# Patient Record
Sex: Female | Born: 1955 | Race: White | Hispanic: No | Marital: Married | Smoking: Never smoker
Health system: Southern US, Community
[De-identification: ages and names within clinical notes are randomized; demographics above are authoritative.]

## PROBLEM LIST (undated history)

## (undated) DIAGNOSIS — R112 Nausea with vomiting, unspecified: Secondary | ICD-10-CM

## (undated) DIAGNOSIS — G4730 Sleep apnea, unspecified: Secondary | ICD-10-CM

## (undated) DIAGNOSIS — E039 Hypothyroidism, unspecified: Secondary | ICD-10-CM

## (undated) DIAGNOSIS — I10 Essential (primary) hypertension: Secondary | ICD-10-CM

## (undated) HISTORY — PX: THYROIDECTOMY: SHX17

## (undated) HISTORY — PX: APPENDECTOMY: SHX54

## (undated) HISTORY — PX: CHOLECYSTECTOMY: SHX55

---

## 2020-01-18 ENCOUNTER — Other Ambulatory Visit: Payer: Self-pay

## 2020-01-18 ENCOUNTER — Emergency Department: Payer: 59

## 2020-01-18 ENCOUNTER — Emergency Department
Admission: EM | Admit: 2020-01-18 | Discharge: 2020-01-18 | Disposition: A | Discharge status: Home / Self Care | Payer: 59 | Attending: Emergency Medicine | Admitting: Emergency Medicine

## 2020-01-18 DIAGNOSIS — Y9301 Activity, walking, marching and hiking: Secondary | ICD-10-CM | POA: Diagnosis not present

## 2020-01-18 DIAGNOSIS — S82034A Nondisplaced transverse fracture of right patella, initial encounter for closed fracture: Secondary | ICD-10-CM | POA: Insufficient documentation

## 2020-01-18 DIAGNOSIS — Z79899 Other long term (current) drug therapy: Secondary | ICD-10-CM | POA: Diagnosis not present

## 2020-01-18 DIAGNOSIS — S8991XA Unspecified injury of right lower leg, initial encounter: Secondary | ICD-10-CM | POA: Diagnosis present

## 2020-01-18 DIAGNOSIS — I10 Essential (primary) hypertension: Secondary | ICD-10-CM | POA: Insufficient documentation

## 2020-01-18 DIAGNOSIS — W19XXXA Unspecified fall, initial encounter: Secondary | ICD-10-CM | POA: Insufficient documentation

## 2020-01-18 DIAGNOSIS — M25461 Effusion, right knee: Secondary | ICD-10-CM | POA: Diagnosis not present

## 2020-01-18 HISTORY — DX: Essential (primary) hypertension: I10

## 2020-01-18 MED ORDER — HYDROCODONE-ACETAMINOPHEN 5-325 MG PO TABS
1.0000 | ORAL_TABLET | Freq: Once | ORAL | Status: AC
  Administered 2020-01-18: 1 via ORAL
  Filled 2020-01-18: qty 1

## 2020-01-18 MED ORDER — HYDROCODONE-ACETAMINOPHEN 5-325 MG PO TABS: 1.0000 | ORAL_TABLET | Freq: Four times a day (QID) | ORAL | 0 refills | Status: AC | PRN

## 2020-01-18 MED ORDER — METHOCARBAMOL 500 MG PO TABS: 500.0000 mg | ORAL_TABLET | Freq: Three times a day (TID) | ORAL | Status: DC | PRN

## 2020-01-18 MED ORDER — NAPROXEN 500 MG PO TABS
500.0000 mg | ORAL_TABLET | Freq: Once | ORAL | Status: AC
  Administered 2020-01-18: 500 mg via ORAL
  Filled 2020-01-18: qty 1

## 2020-01-18 MED ORDER — METHOCARBAMOL 500 MG PO TABS: 500.0000 mg | ORAL_TABLET | Freq: Three times a day (TID) | ORAL | 0 refills | Status: DC | PRN

## 2020-01-18 MED ORDER — NAPROXEN 500 MG PO TABS: 500.0000 mg | ORAL_TABLET | Freq: Two times a day (BID) | ORAL | 0 refills | Status: DC

## 2020-01-18 NOTE — ED Triage Notes (Signed)
First Rn Note: pt presents to ED via [facility] with c/o R knee pain s/p fall after mechanical fall while walking dogs. Per EMS pt with pain with movement/straightening her knee. Reports ice pack placed to R knee prior to arrival.    64HR 150/66

## 2020-01-18 NOTE — ED Notes (Signed)
See triage notes, Pt unable to bear weight on right knee due to pain. Pt AOX4, in wheelchair at this time.

## 2020-01-18 NOTE — ED Provider Notes (Signed)
[facility] Emergency Department Provider Note ____________________________________________  Time seen: Approximately 6:01 PM  I have reviewed the triage vital signs and the nursing notes.   HISTORY  Chief Complaint Knee Pain and Fall    HPI Leslie Hale is a 64 y.o. female who presents to the emergency department for evaluation and treatment of right knee pain after a mechanical, non-syncopal fall while walking the dog. She landed directly on her right knee and was unable to get up without assistance. Pain with any attempts to bear weight. Swelling started immediately.   Past Medical History:  Diagnosis Date  . Hypertension     There are no problems to display for this patient.   History reviewed. No pertinent surgical history.  Prior to Admission medications   Medication Sig Start Date End Date Taking? Authorizing Provider  HYDROcodone-acetaminophen (NORCO/VICODIN) 5-325 MG tablet Take 1 tablet by mouth every 6 (six) hours as needed for up to 3 days for severe pain. 01/18/20 01/21/20  Pharrah Rottman, Kasandra Knudsen, FNP  methocarbamol (ROBAXIN) 500 MG tablet Take 1 tablet (500 mg total) by mouth every 8 (eight) hours as needed. 01/18/20   Bradlee Heitman, Rulon Eisenmenger B, FNP  naproxen (NAPROSYN) 500 MG tablet Take 1 tablet (500 mg total) by mouth 2 (two) times daily with a meal. 01/18/20   Ursula Dermody B, FNP    Allergies Codeine  No family history on file.  Social History Social History   Tobacco Use  . Smoking status: Never Smoker  . Smokeless tobacco: Never Used  Substance Use Topics  . Alcohol use: Yes    Comment: occ  . Drug use: Never    Review of Systems Constitutional: Negative for fever. Cardiovascular: Negative for chest pain. Respiratory: Negative for shortness of breath. Musculoskeletal: Positive for right knee pain Skin: Positive for abrasions over the right knee.  Neurological: Negative for decrease in  sensation  ____________________________________________   PHYSICAL EXAM:  VITAL SIGNS: ED Triage Vitals  Enc Vitals Group     BP 01/18/20 1624 (!) 181/107     Pulse Rate 01/18/20 1624 (!) 55     Resp 01/18/20 1624 18     Temp 01/18/20 1624 98.5 F (36.9 C)     Temp Source 01/18/20 1624 Oral     SpO2 01/18/20 1624 99 %     Weight 01/18/20 1624 180 lb (81.6 kg)     Height 01/18/20 1624 5\' 6"  (1.676 m)     Head Circumference --      Peak Flow --      Pain Score 01/18/20 1631 5     Pain Loc --      Pain Edu? --      Excl. in [place]? --     Constitutional: Alert and oriented. Well appearing and in no acute distress. Eyes: Conjunctivae are clear without discharge or drainage Head: Atraumatic Neck: Supple Respiratory: No cough. Respirations are even and unlabored. Musculoskeletal: Focal tenderness over the patella of the right knee. Little active ROM due to pain. FROM of the hip and ankle of the RLE.  Neurologic: Motor and sensory function intact.  Skin: Abrasions over the right patella with swelling and erythema.  Psychiatric: Affect and behavior are appropriate.  ____________________________________________   LABS (all labs ordered are listed, but only abnormal results are displayed)  Labs Reviewed - No data to display ____________________________________________  RADIOLOGY  Patella fracture.  I, 01/20/20, personally viewed and evaluated these images (plain radiographs) as part of my medical  decision making, as well as reviewing the written report by the radiologist.  DG Knee Complete 4 Views Right  Result Date: 01/18/2020 CLINICAL DATA:  Pain EXAM: RIGHT KNEE - COMPLETE 4+ VIEW COMPARISON:  None. FINDINGS: There is a large joint effusion with lipohemarthrosis. There is a nondisplaced transverse fracture through the lower pole of the patella. There is significant prepatellar soft tissue swelling. There is no dislocation. IMPRESSION: 1. Acute nondisplaced transverse  fracture through the lower pole of the patella. 2. Large joint effusion with lipohemarthrosis. 3. Prepatellar soft tissue swelling. Electronically Signed   By: Katherine Mantle M.D.   On: 01/18/2020 17:43   ____________________________________________   PROCEDURES  .Ortho Injury Treatment  Date/Time: 01/18/2020 7:53 PM Performed by: Chinita Pester, FNP Authorized by: Chinita Pester, FNP   Consent:    Consent obtained:  Verbal   Consent given by:  Patient   Risks discussed:  Stiffness and restricted joint movementInjury location: knee Location details: right knee Injury type: fracture Fracture type: patellar Pre-procedure neurovascular assessment: neurovascularly intact Pre-procedure distal perfusion: normal Pre-procedure neurological function: normal Pre-procedure range of motion: reduced Immobilization: brace Splint type: knee immobilizer. Post-procedure neurovascular assessment: post-procedure neurovascularly intact Post-procedure distal perfusion: normal Post-procedure neurological function: normal Post-procedure range of motion: unchanged Patient tolerance: patient tolerated the procedure well with no immediate complications     ____________________________________________   INITIAL IMPRESSION / ASSESSMENT AND PLAN / ED COURSE  Leslie Hale is a 64 y.o. who presents to the emergency department for evaluation after mechanical, non-syncopal fall prior to arrival. See HPI for further details.   X-ray shows patella fracture. She will be placed in a knee immobilizer and given instructions to RICE. She is to call and schedule a follow up with orthopedics and will be provided a referral. She will be prescribed meloxicam, robaxin, and norco. She is to return to the ER for symptoms that change or worsen or for new concerns if unable to see orthopedics right away.  Medications  methocarbamol (ROBAXIN) tablet 500 mg (has no administration in time range)  naproxen  (NAPROSYN) tablet 500 mg (500 mg Oral Given 01/18/20 1917)  HYDROcodone-acetaminophen (NORCO/VICODIN) 5-325 MG per tablet 1 tablet (1 tablet Oral Given 01/18/20 1917)    Pertinent labs & imaging results that were available during my care of the patient were reviewed by me and considered in my medical decision making (see chart for details).   _________________________________________   FINAL CLINICAL IMPRESSION(S) / ED DIAGNOSES  Final diagnoses:  Closed nondisplaced transverse fracture of right patella, initial encounter  Effusion of right knee    ED Discharge Orders         Ordered    naproxen (NAPROSYN) 500 MG tablet  2 times daily with meals        01/18/20 1758    methocarbamol (ROBAXIN) 500 MG tablet  Every 8 hours PRN        01/18/20 1758    HYDROcodone-acetaminophen (NORCO/VICODIN) 5-325 MG tablet  Every 6 hours PRN        01/18/20 1854           If controlled substance prescribed during this visit, 12 month history viewed on the [facility] prior to issuing an initial prescription for Schedule II or III opiod.   Chinita Pester, FNP 01/18/20 1955    Gilles Chiquito, MD 01/18/20 2216

## 2020-01-18 NOTE — ED Triage Notes (Signed)
Pt comes via EMS with c/o fall and hitting right knee. Pt states she was walking her dogs and lost her balance. Pt states she hurt a pop and unable to bear weight on leg.  Pt has redness and swelling noted to right knee.

## 2020-01-18 NOTE — Discharge Instructions (Signed)
Please call Dr. Neomia Glass office tomorrow to request a follow up appointment.  Take the naprosyn daily and the robaxin if needed for muscle tension/spasm.  Use ice 20 min/hr off and on throughout the day.  Rest, ice, and elevate your leg as much as possible until evaluated by orthopedics.   Return to the ER for symptoms that change or worsen or for new concerns if unable to see orthopedics.

## 2021-05-18 ENCOUNTER — Other Ambulatory Visit: Payer: Self-pay | Admitting: Infectious Diseases

## 2021-05-18 DIAGNOSIS — R7989 Other specified abnormal findings of blood chemistry: Secondary | ICD-10-CM

## 2021-05-18 DIAGNOSIS — Z1231 Encounter for screening mammogram for malignant neoplasm of breast: Secondary | ICD-10-CM

## 2021-05-19 ENCOUNTER — Ambulatory Visit
Admission: RE | Admit: 2021-05-19 | Discharge: 2021-05-19 | Disposition: A | Discharge status: Home / Self Care | Payer: BC Managed Care – PPO | Source: Ambulatory Visit | Attending: Infectious Diseases | Admitting: Infectious Diseases

## 2021-05-19 DIAGNOSIS — R7989 Other specified abnormal findings of blood chemistry: Secondary | ICD-10-CM | POA: Insufficient documentation

## 2021-05-21 ENCOUNTER — Other Ambulatory Visit: Payer: Self-pay

## 2021-05-21 ENCOUNTER — Emergency Department: Payer: BC Managed Care – PPO

## 2021-05-21 ENCOUNTER — Observation Stay
Admission: EM | Admit: 2021-05-21 | Discharge: 2021-05-23 | Disposition: A | Discharge status: Home / Self Care | Payer: BC Managed Care – PPO | Attending: Emergency Medicine | Admitting: Emergency Medicine

## 2021-05-21 ENCOUNTER — Encounter: Payer: Self-pay | Admitting: *Deleted

## 2021-05-21 DIAGNOSIS — U071 COVID-19: Secondary | ICD-10-CM | POA: Insufficient documentation

## 2021-05-21 DIAGNOSIS — K8012 Calculus of gallbladder with acute and chronic cholecystitis without obstruction: Secondary | ICD-10-CM | POA: Diagnosis not present

## 2021-05-21 DIAGNOSIS — Z79899 Other long term (current) drug therapy: Secondary | ICD-10-CM | POA: Diagnosis not present

## 2021-05-21 DIAGNOSIS — R7989 Other specified abnormal findings of blood chemistry: Secondary | ICD-10-CM | POA: Insufficient documentation

## 2021-05-21 DIAGNOSIS — Z419 Encounter for procedure for purposes other than remedying health state, unspecified: Secondary | ICD-10-CM

## 2021-05-21 DIAGNOSIS — I10 Essential (primary) hypertension: Secondary | ICD-10-CM | POA: Diagnosis not present

## 2021-05-21 DIAGNOSIS — K8020 Calculus of gallbladder without cholecystitis without obstruction: Secondary | ICD-10-CM | POA: Diagnosis present

## 2021-05-21 DIAGNOSIS — R1011 Right upper quadrant pain: Secondary | ICD-10-CM

## 2021-05-21 DIAGNOSIS — K8021 Calculus of gallbladder without cholecystitis with obstruction: Secondary | ICD-10-CM

## 2021-05-21 HISTORY — DX: Other specified postprocedural states: R11.2

## 2021-05-21 LAB — URINALYSIS, ROUTINE W REFLEX MICROSCOPIC
Bacteria, UA: NONE SEEN
Glucose, UA: 100 mg/dL — AB
Leukocytes,Ua: NEGATIVE
Nitrite: NEGATIVE
Protein, ur: NEGATIVE mg/dL
Specific Gravity, Urine: >1.03 — ABNORMAL HIGH (ref 1.005–1.030)
pH: 5 (ref 5.0–8.0)

## 2021-05-21 LAB — COMPREHENSIVE METABOLIC PANEL
ALT: 319 U/L — ABNORMAL HIGH (ref 0–44)
AST: 308 U/L — ABNORMAL HIGH (ref 15–41)
Albumin: 3.8 g/dL (ref 3.5–5.0)
Alkaline Phosphatase: 157 U/L — ABNORMAL HIGH (ref 38–126)
Anion gap: 10 (ref 5–15)
BUN: 16 mg/dL (ref 8–23)
CO2: 26 mmol/L (ref 22–32)
Calcium: 9.3 mg/dL (ref 8.9–10.3)
Chloride: 103 mmol/L (ref 98–111)
Creatinine, Ser: 0.79 mg/dL (ref 0.44–1.00)
GFR, Estimated: >60 mL/min (ref >60)
Glucose, Bld: 145 mg/dL — ABNORMAL HIGH (ref 70–99)
Potassium: 4 mmol/L (ref 3.5–5.1)
Sodium: 139 mmol/L (ref 135–145)
Total Bilirubin: 1.6 mg/dL — ABNORMAL HIGH (ref 0.3–1.2)
Total Protein: 7 g/dL (ref 6.5–8.1)

## 2021-05-21 LAB — CBC
HCT: 38.5 % (ref 36.0–46.0)
Hemoglobin: 12.6 g/dL (ref 12.0–15.0)
MCH: 31.5 pg (ref 26.0–34.0)
MCHC: 32.7 g/dL (ref 30.0–36.0)
MCV: 96.3 fL (ref 80.0–100.0)
Platelets: 261 10*3/uL (ref 150–400)
RBC: 4 MIL/uL (ref 3.87–5.11)
RDW: 13.1 % (ref 11.5–15.5)
WBC: 7.8 10*3/uL (ref 4.0–10.5)
nRBC: 0 % (ref 0.0–0.2)

## 2021-05-21 LAB — LIPASE, BLOOD: Lipase: 30 U/L (ref 11–51)

## 2021-05-21 MED ORDER — KETOROLAC TROMETHAMINE 30 MG/ML IJ SOLN
15.0000 mg | Freq: Once | INTRAMUSCULAR | Status: AC
  Administered 2021-05-21: 15 mg via INTRAVENOUS
  Filled 2021-05-21: qty 1

## 2021-05-21 MED ORDER — SODIUM CHLORIDE 0.9 % IV SOLN: Freq: Once | INTRAVENOUS | Status: AC

## 2021-05-21 MED ORDER — ONDANSETRON HCL 4 MG/2ML IJ SOLN
4.0000 mg | Freq: Once | INTRAMUSCULAR | Status: AC
  Administered 2021-05-21: 4 mg via INTRAVENOUS
  Filled 2021-05-21: qty 2

## 2021-05-21 MED ORDER — MORPHINE SULFATE (PF) 4 MG/ML IV SOLN: 4.0000 mg | INTRAVENOUS | Status: DC | PRN

## 2021-05-21 MED ORDER — SODIUM CHLORIDE 0.9 % IV BOLUS
500.0000 mL | Freq: Once | INTRAVENOUS | Status: AC
  Administered 2021-05-21: 500 mL via INTRAVENOUS

## 2021-05-21 NOTE — ED Triage Notes (Addendum)
Pt has right upper abd pain.  Pt has appt with surgeon tomorrow for gallbladder problem.  Pain worse tonight.   No n/v/   denies chest pain or sob.  pt alert  speech clear.

## 2021-05-21 NOTE — ED Provider Notes (Signed)
[facility] Provider Note    Event Date/Time   First MD Initiated Contact with Patient 05/21/21 2129     (approximate)   History   Abdominal Pain   HPI  Leslie Hale is a 66 y.o. female with recent diagnosis of cholelithiasis recent COVID illness and flulike illness over the holidays presents to the ER for worsening right upper quadrant pain associate with nausea vomiting and chills.  States the pain is moderate to severe aching and throbbing to the right upper quadrant.  No radiation through to her back or shoulder.  Has never had symptoms like this before.  No recent new medications.     Physical Exam   Triage Vital Signs: ED Triage Vitals  Enc Vitals Group     BP 05/21/21 2242 (!) 144/62     Pulse Rate 05/21/21 2242 (!) 53     Resp 05/21/21 2242 18     Temp 05/21/21 2242 97.9 F (36.6 C)     Temp Source 05/21/21 2242 Oral     SpO2 05/21/21 2242 100 %     Weight 05/21/21 2043 180 lb (81.6 kg)     Height 05/21/21 2043 5\' 6"  (1.676 m)     Head Circumference --      Peak Flow --      Pain Score 05/21/21 2043 8     Pain Loc --      Pain Edu? --      Excl. in [place]? --     Most recent vital signs: Vitals:   05/21/21 2242  BP: (!) 144/62  Pulse: (!) 53  Resp: 18  Temp: 97.9 F (36.6 C)  SpO2: 100%     Constitutional: Alert  Eyes: Conjunctivae are normal.  Head: Atraumatic. Nose: No congestion/rhinnorhea. Mouth/Throat: Mucous membranes are moist.   Neck: Painless ROM.  Cardiovascular:   Good peripheral circulation. Respiratory: Normal respiratory effort.  No retractions.  Gastrointestinal: Soft with mild epigastric and ruq ttp, no guarding or rebound Musculoskeletal:  no deformity Neurologic:  MAE spontaneously. No gross focal neurologic deficits are appreciated.  Skin:  Skin is warm, dry and intact. No rash noted. Psychiatric: Mood and affect are normal. Speech and behavior are normal.    ED Results / Procedures / Treatments    Labs (all labs ordered are listed, but only abnormal results are displayed) Labs Reviewed  COMPREHENSIVE METABOLIC PANEL - Abnormal; Notable for the following components:      Result Value   Glucose, Bld 145 (*)    AST 308 (*)    ALT 319 (*)    Alkaline Phosphatase 157 (*)    Total Bilirubin 1.6 (*)    All other components within normal limits  URINALYSIS, ROUTINE W REFLEX MICROSCOPIC - Abnormal; Notable for the following components:   APPearance CLEAR (*)    Specific Gravity, Urine >1.030 (*)    Glucose, UA 100 (*)    Hgb urine dipstick TRACE (*)    Bilirubin Urine SMALL (*)    Ketones, ur TRACE (*)    All other components within normal limits  LIPASE, BLOOD  CBC     EKG     RADIOLOGY Please see ED Course for my review and interpretation.  I personally reviewed all radiographic images ordered to evaluate for the above acute complaints and reviewed radiology reports and findings.  These findings were personally discussed with the patient.  Please see medical record for radiology report.    PROCEDURES:  Critical  Care performed: No  Procedures   MEDICATIONS ORDERED IN ED: Medications  morphine 4 MG/ML injection 4 mg (has no administration in time range)  sodium chloride 0.9 % bolus 500 mL (has no administration in time range)  0.9 %  sodium chloride infusion (has no administration in time range)  sodium chloride 0.9 % bolus 500 mL (500 mLs Intravenous New Bag/Given 05/21/21 2237)  ketorolac (TORADOL) 30 MG/ML injection 15 mg (15 mg Intravenous Given 05/21/21 2237)  ondansetron (ZOFRAN) injection 4 mg (4 mg Intravenous Given 05/21/21 2237)     IMPRESSION / MDM / ASSESSMENT AND PLAN / ED COURSE  I reviewed the triage vital signs and the nursing notes.                              Differential diagnosis includes, but is not limited to, cholelithiasis, choledocholithiasis, cholecystitis, enteritis, dehydration, biliary colic, hepatitis  Presentation as  described above.  Patient is dehydrated appearing uncomfortable appearing as well have ordered IV pain medication including IV narcotics and IV Toradol as well as IV antiemetic and IV fluids.  Does not have any leukocytosis but does have persistently elevated LFTs we will repeat ultrasound of the right upper quadrant.   Clinical Course as of 05/21/21 2259  Thu May 21, 2021  2236 Right upper quadrant ultrasound by my review shows evidence of cholelithiasis. [PR]  2251 I discussed the results of elevated LFTs with normal white count and ultrasound results with Dr. Allen Norris of GI who has recommended MRCP for further evaluation.  This plan was discussed with the patient and family at bedside.  She is agreeable to plan.  Patient will be signed out to oncoming physician [PR]    Clinical Course User Index [PR] Merlyn Lot, MD     FINAL CLINICAL IMPRESSION(S) / ED DIAGNOSES   Final diagnoses:  Elevated LFTs  Calculus of gallbladder with biliary obstruction but without cholecystitis     Rx / DC Orders   ED Discharge Orders     None        Note:  This document was prepared using Dragon voice recognition software and may include unintentional dictation errors.    Merlyn Lot, MD 05/21/21 2300

## 2021-05-21 NOTE — ED Notes (Signed)
Pt unable to void at this time. 

## 2021-05-22 ENCOUNTER — Encounter
Admission: EM | Disposition: A | Discharge status: Home / Self Care | Payer: Self-pay | Source: Home / Self Care | Attending: Student in an Organized Health Care Education/Training Program

## 2021-05-22 ENCOUNTER — Observation Stay: Payer: BC Managed Care – PPO

## 2021-05-22 ENCOUNTER — Other Ambulatory Visit: Payer: Self-pay

## 2021-05-22 ENCOUNTER — Observation Stay: Payer: BC Managed Care – PPO | Admitting: Certified Registered Nurse Anesthetist

## 2021-05-22 ENCOUNTER — Encounter: Payer: Self-pay | Admitting: Surgery

## 2021-05-22 DIAGNOSIS — K8020 Calculus of gallbladder without cholecystitis without obstruction: Secondary | ICD-10-CM | POA: Diagnosis present

## 2021-05-22 HISTORY — PX: INTRAOPERATIVE CHOLANGIOGRAM: SHX5230

## 2021-05-22 LAB — RESP PANEL BY RT-PCR (FLU A&B, COVID) ARPGX2
Influenza A by PCR: NEGATIVE
Influenza B by PCR: NEGATIVE
SARS Coronavirus 2 by RT PCR: POSITIVE — AB

## 2021-05-22 LAB — CBC
HCT: 40.2 % (ref 36.0–46.0)
Hemoglobin: 13.2 g/dL (ref 12.0–15.0)
MCH: 31.1 pg (ref 26.0–34.0)
MCHC: 32.8 g/dL (ref 30.0–36.0)
MCV: 94.8 fL (ref 80.0–100.0)
Platelets: 248 10*3/uL (ref 150–400)
RBC: 4.24 MIL/uL (ref 3.87–5.11)
RDW: 13.1 % (ref 11.5–15.5)
WBC: 5 10*3/uL (ref 4.0–10.5)
nRBC: 0 % (ref 0.0–0.2)

## 2021-05-22 LAB — COMPREHENSIVE METABOLIC PANEL
ALT: 466 U/L — ABNORMAL HIGH (ref 0–44)
AST: 526 U/L — ABNORMAL HIGH (ref 15–41)
Albumin: 3.7 g/dL (ref 3.5–5.0)
Alkaline Phosphatase: 184 U/L — ABNORMAL HIGH (ref 38–126)
Anion gap: 9 (ref 5–15)
BUN: 12 mg/dL (ref 8–23)
CO2: 25 mmol/L (ref 22–32)
Calcium: 8.8 mg/dL — ABNORMAL LOW (ref 8.9–10.3)
Chloride: 105 mmol/L (ref 98–111)
Creatinine, Ser: 0.69 mg/dL (ref 0.44–1.00)
GFR, Estimated: >60 mL/min (ref >60)
Glucose, Bld: 85 mg/dL (ref 70–99)
Potassium: 3.9 mmol/L (ref 3.5–5.1)
Sodium: 139 mmol/L (ref 135–145)
Total Bilirubin: 2.4 mg/dL — ABNORMAL HIGH (ref 0.3–1.2)
Total Protein: 6.9 g/dL (ref 6.5–8.1)

## 2021-05-22 LAB — MAGNESIUM: Magnesium: 2.2 mg/dL (ref 1.7–2.4)

## 2021-05-22 LAB — HIV ANTIBODY (ROUTINE TESTING W REFLEX): HIV Screen 4th Generation wRfx: NONREACTIVE

## 2021-05-22 SURGERY — CHOLECYSTECTOMY, ROBOT-ASSISTED, LAPAROSCOPIC
Anesthesia: General | Site: Abdomen

## 2021-05-22 MED ORDER — TRAMADOL HCL 50 MG PO TABS
50.0000 mg | ORAL_TABLET | Freq: Four times a day (QID) | ORAL | 0 refills | Status: AC | PRN
Start: 2021-05-22 — End: 2022-05-22

## 2021-05-22 MED ORDER — FENTANYL CITRATE (PF) 100 MCG/2ML IJ SOLN
25.0000 ug | INTRAMUSCULAR | Status: DC | PRN
  Administered 2021-05-22 (×2): 25 ug via INTRAVENOUS

## 2021-05-22 MED ORDER — FENTANYL CITRATE (PF) 100 MCG/2ML IJ SOLN
INTRAMUSCULAR | Status: DC | PRN
  Administered 2021-05-22: 50 ug via INTRAVENOUS

## 2021-05-22 MED ORDER — CEFAZOLIN SODIUM-DEXTROSE 2-4 GM/100ML-% IV SOLN
INTRAVENOUS | Status: AC
  Filled 2021-05-22: qty 100

## 2021-05-22 MED ORDER — MIDAZOLAM HCL 2 MG/2ML IJ SOLN
INTRAMUSCULAR | Status: AC
  Filled 2021-05-22: qty 2

## 2021-05-22 MED ORDER — ONDANSETRON HCL 4 MG/2ML IJ SOLN: 4.0000 mg | Freq: Four times a day (QID) | INTRAMUSCULAR | Status: DC | PRN

## 2021-05-22 MED ORDER — 0.9 % SODIUM CHLORIDE (POUR BTL) OPTIME
TOPICAL | Status: DC | PRN
  Administered 2021-05-22: 500 mL

## 2021-05-22 MED ORDER — PROMETHAZINE HCL 25 MG/ML IJ SOLN
INTRAMUSCULAR | Status: AC
  Filled 2021-05-22: qty 1

## 2021-05-22 MED ORDER — DEXAMETHASONE SODIUM PHOSPHATE 10 MG/ML IJ SOLN
INTRAMUSCULAR | Status: DC | PRN
Start: 2021-05-22 — End: 2021-05-22
  Administered 2021-05-22: 10 mg via INTRAVENOUS

## 2021-05-22 MED ORDER — PROMETHAZINE HCL 25 MG/ML IJ SOLN
6.2500 mg | INTRAMUSCULAR | Status: DC | PRN
  Administered 2021-05-22: 6.25 mg via INTRAVENOUS

## 2021-05-22 MED ORDER — GLYCOPYRROLATE 0.2 MG/ML IJ SOLN
INTRAMUSCULAR | Status: DC | PRN
  Administered 2021-05-22: .2 mg via INTRAVENOUS

## 2021-05-22 MED ORDER — ACETAMINOPHEN 500 MG PO TABS
1000.0000 mg | ORAL_TABLET | Freq: Four times a day (QID) | ORAL | Status: DC
  Administered 2021-05-23: 07:00:00 1000 mg via ORAL
  Filled 2021-05-22 (×4): qty 2

## 2021-05-22 MED ORDER — MIDAZOLAM HCL 2 MG/2ML IJ SOLN
INTRAMUSCULAR | Status: DC | PRN
  Administered 2021-05-22: 2 mg via INTRAVENOUS

## 2021-05-22 MED ORDER — ATENOLOL 25 MG PO TABS
25.0000 mg | ORAL_TABLET | Freq: Every day | ORAL | Status: DC
  Administered 2021-05-22: 25 mg via ORAL
  Filled 2021-05-22 (×2): qty 1

## 2021-05-22 MED ORDER — APREPITANT 40 MG PO CAPS: 40.0000 mg | ORAL_CAPSULE | Freq: Once | ORAL | Status: AC

## 2021-05-22 MED ORDER — APREPITANT 40 MG PO CAPS
ORAL_CAPSULE | ORAL | Status: AC
  Administered 2021-05-22: 40 mg via ORAL
  Filled 2021-05-22: qty 1

## 2021-05-22 MED ORDER — SODIUM CHLORIDE (PF) 0.9 % IJ SOLN
INTRAMUSCULAR | Status: AC
  Filled 2021-05-22: qty 50

## 2021-05-22 MED ORDER — ATENOLOL 50 MG PO TABS
50.0000 mg | ORAL_TABLET | Freq: Every day | ORAL | Status: DC
  Filled 2021-05-22: qty 1

## 2021-05-22 MED ORDER — SUGAMMADEX SODIUM 500 MG/5ML IV SOLN
INTRAVENOUS | Status: DC | PRN
  Administered 2021-05-22: 300 mg via INTRAVENOUS

## 2021-05-22 MED ORDER — LACTATED RINGERS IV SOLN: INTRAVENOUS | Status: DC

## 2021-05-22 MED ORDER — CEFAZOLIN SODIUM-DEXTROSE 2-4 GM/100ML-% IV SOLN
2.0000 g | INTRAVENOUS | Status: AC
  Administered 2021-05-22: 2 g via INTRAVENOUS

## 2021-05-22 MED ORDER — SODIUM CHLORIDE 0.9 % IV SOLN
INTRAVENOUS | Status: DC | PRN
  Administered 2021-05-22: 60 mL

## 2021-05-22 MED ORDER — BUPIVACAINE-EPINEPHRINE 0.25% -1:200000 IJ SOLN
INTRAMUSCULAR | Status: DC | PRN
  Administered 2021-05-22: 30 mL

## 2021-05-22 MED ORDER — HYDROMORPHONE HCL 1 MG/ML IJ SOLN: 0.5000 mg | INTRAMUSCULAR | Status: DC | PRN

## 2021-05-22 MED ORDER — PANTOPRAZOLE SODIUM 40 MG IV SOLR: 40.0000 mg | Freq: Every day | INTRAVENOUS | Status: DC

## 2021-05-22 MED ORDER — KETOROLAC TROMETHAMINE 30 MG/ML IJ SOLN
INTRAMUSCULAR | Status: DC | PRN
  Administered 2021-05-22: 30 mg via INTRAVENOUS

## 2021-05-22 MED ORDER — PROPOFOL 10 MG/ML IV BOLUS
INTRAVENOUS | Status: DC | PRN
Start: 2021-05-22 — End: 2021-05-22
  Administered 2021-05-22: 200 mg via INTRAVENOUS

## 2021-05-22 MED ORDER — PANTOPRAZOLE SODIUM 40 MG IV SOLR
40.0000 mg | INTRAVENOUS | Status: DC
  Administered 2021-05-22: 40 mg via INTRAVENOUS
  Filled 2021-05-22: qty 40

## 2021-05-22 MED ORDER — FENTANYL CITRATE (PF) 100 MCG/2ML IJ SOLN
INTRAMUSCULAR | Status: AC
  Filled 2021-05-22: qty 2

## 2021-05-22 MED ORDER — POLYETHYLENE GLYCOL 3350 17 G PO PACK: 17.0000 g | PACK | Freq: Every day | ORAL | Status: DC | PRN

## 2021-05-22 MED ORDER — LACTATED RINGERS IV SOLN: INTRAVENOUS | Status: DC | PRN

## 2021-05-22 MED ORDER — ROCURONIUM BROMIDE 100 MG/10ML IV SOLN
INTRAVENOUS | Status: DC | PRN
  Administered 2021-05-22: 50 mg via INTRAVENOUS
  Administered 2021-05-22: 10 mg via INTRAVENOUS

## 2021-05-22 MED ORDER — ONDANSETRON HCL 4 MG/2ML IJ SOLN
INTRAMUSCULAR | Status: DC | PRN
  Administered 2021-05-22: 4 mg via INTRAVENOUS

## 2021-05-22 MED ORDER — BUPIVACAINE-EPINEPHRINE (PF) 0.25% -1:200000 IJ SOLN
INTRAMUSCULAR | Status: AC
  Filled 2021-05-22: qty 30

## 2021-05-22 MED ORDER — SODIUM CHLORIDE FLUSH 0.9 % IV SOLN
INTRAVENOUS | Status: AC
  Filled 2021-05-22: qty 10

## 2021-05-22 MED ORDER — PHENYLEPHRINE HCL-NACL 20-0.9 MG/250ML-% IV SOLN
INTRAVENOUS | Status: DC | PRN
  Administered 2021-05-22: 40 ug/min via INTRAVENOUS

## 2021-05-22 MED ORDER — SEVOFLURANE IN SOLN
RESPIRATORY_TRACT | Status: AC
  Filled 2021-05-22: qty 250

## 2021-05-22 MED ORDER — LIDOCAINE HCL (CARDIAC) PF 100 MG/5ML IV SOSY
PREFILLED_SYRINGE | INTRAVENOUS | Status: DC | PRN
  Administered 2021-05-22: 100 mg via INTRAVENOUS

## 2021-05-22 MED ORDER — LEVOTHYROXINE SODIUM 100 MCG PO TABS
100.0000 ug | ORAL_TABLET | Freq: Every day | ORAL | Status: DC
  Administered 2021-05-22 – 2021-05-23 (×2): 100 ug via ORAL
  Filled 2021-05-22: qty 1
  Filled 2021-05-22: qty 2

## 2021-05-22 MED ORDER — FENTANYL CITRATE (PF) 100 MCG/2ML IJ SOLN
INTRAMUSCULAR | Status: AC
  Administered 2021-05-22: 25 ug via INTRAVENOUS
  Filled 2021-05-22: qty 2

## 2021-05-22 MED ORDER — INDOCYANINE GREEN 25 MG IV SOLR
2.5000 mg | INTRAVENOUS | Status: AC
  Administered 2021-05-22: 2.5 mg via INTRAVENOUS
  Filled 2021-05-22: qty 1

## 2021-05-22 MED ORDER — ONDANSETRON 4 MG PO TBDP
4.0000 mg | ORAL_TABLET | Freq: Four times a day (QID) | ORAL | Status: DC | PRN
  Filled 2021-05-22: qty 1

## 2021-05-22 SURGICAL SUPPLY — 52 items
BAG INFUSER PRESSURE 100CC (MISCELLANEOUS) IMPLANT
BLADE SURG SZ11 CARB STEEL (BLADE) ×2 IMPLANT
CANNULA REDUC XI 12-8 STAPL (CANNULA) ×1
CANNULA REDUCER 12-8 DVNC XI (CANNULA) ×1 IMPLANT
CATH REDDICK CHOLANGI 4FR 50CM (CATHETERS) ×1 IMPLANT
CLIP LIGATING HEM O LOK PURPLE (MISCELLANEOUS) ×2 IMPLANT
CLIP LIGATING HEMO O LOK GREEN (MISCELLANEOUS) ×2 IMPLANT
DECANTER SPIKE VIAL GLASS SM (MISCELLANEOUS) ×4 IMPLANT
DERMABOND ADVANCED (GAUZE/BANDAGES/DRESSINGS) ×1
DERMABOND ADVANCED .7 DNX12 (GAUZE/BANDAGES/DRESSINGS) ×1 IMPLANT
DRAPE ARM DVNC X/XI (DISPOSABLE) ×4 IMPLANT
DRAPE C-ARM XRAY 36X54 (DRAPES) ×1 IMPLANT
DRAPE COLUMN DVNC XI (DISPOSABLE) ×1 IMPLANT
DRAPE DA VINCI XI ARM (DISPOSABLE) ×4
DRAPE DA VINCI XI COLUMN (DISPOSABLE) ×1
ELECT REM PT RETURN 9FT ADLT (ELECTROSURGICAL) ×2
ELECTRODE REM PT RTRN 9FT ADLT (ELECTROSURGICAL) ×1 IMPLANT
GLOVE SURG ENC MOIS LTX SZ6.5 (GLOVE) ×4 IMPLANT
GLOVE SURG UNDER POLY LF SZ6.5 (GLOVE) ×4 IMPLANT
GOWN STRL REUS W/ TWL LRG LVL3 (GOWN DISPOSABLE) ×3 IMPLANT
GOWN STRL REUS W/TWL LRG LVL3 (GOWN DISPOSABLE) ×3
GRASPER SUT TROCAR 14GX15 (MISCELLANEOUS) ×2 IMPLANT
IRRIGATOR SUCT 8 DISP DVNC XI (IRRIGATION / IRRIGATOR) IMPLANT
IRRIGATOR SUCTION 8MM XI DISP (IRRIGATION / IRRIGATOR)
IV CATH ANGIO 12GX3 LT BLUE (NEEDLE) ×1 IMPLANT
IV NS 1000ML (IV SOLUTION)
IV NS 1000ML BAXH (IV SOLUTION) IMPLANT
KIT PINK PAD W/HEAD ARE REST (MISCELLANEOUS) ×2 IMPLANT
KIT PINK PAD W/HEAD ARM REST (MISCELLANEOUS) ×1 IMPLANT
LABEL OR SOLS (LABEL) ×2 IMPLANT
MANIFOLD NEPTUNE II (INSTRUMENTS) ×2 IMPLANT
NDL INSUFFLATION 14GA 120MM (NEEDLE) ×1 IMPLANT
NEEDLE HYPO 22GX1.5 SAFETY (NEEDLE) ×2 IMPLANT
NEEDLE INSUFFLATION 14GA 120MM (NEEDLE) ×2 IMPLANT
NS IRRIG 500ML POUR BTL (IV SOLUTION) ×2 IMPLANT
OBTURATOR OPTICAL STANDARD 8MM (TROCAR) ×1
OBTURATOR OPTICAL STND 8 DVNC (TROCAR) ×1
OBTURATOR OPTICALSTD 8 DVNC (TROCAR) ×1 IMPLANT
PACK LAP CHOLECYSTECTOMY (MISCELLANEOUS) ×2 IMPLANT
POUCH SPECIMEN RETRIEVAL 10MM (ENDOMECHANICALS) ×2 IMPLANT
SEAL CANN UNIV 5-8 DVNC XI (MISCELLANEOUS) ×3 IMPLANT
SEAL XI 5MM-8MM UNIVERSAL (MISCELLANEOUS) ×3
SET TUBE SMOKE EVAC HIGH FLOW (TUBING) ×2 IMPLANT
SOLUTION ELECTROLUBE (MISCELLANEOUS) ×2 IMPLANT
SPONGE T-LAP 4X18 ~~LOC~~+RFID (SPONGE) IMPLANT
STAPLER CANNULA SEAL DVNC XI (STAPLE) ×1 IMPLANT
STAPLER CANNULA SEAL XI (STAPLE) ×1
SUT MNCRL 4-0 (SUTURE) ×2
SUT MNCRL 4-0 27XMFL (SUTURE) ×2
SUT VICRYL 0 AB UR-6 (SUTURE) ×2 IMPLANT
SUTURE MNCRL 4-0 27XMF (SUTURE) ×1 IMPLANT
WATER STERILE IRR 500ML POUR (IV SOLUTION) ×2 IMPLANT

## 2021-05-22 NOTE — Transfer of Care (Signed)
Immediate Anesthesia Transfer of Care Note  Patient: Leslie Hale  Procedure(s) Performed: XI ROBOTIC ASSISTED LAPAROSCOPIC CHOLECYSTECTOMY (Abdomen) INDOCYANINE GREEN FLUORESCENCE IMAGING (ICG) (Abdomen) INTRAOPERATIVE CHOLANGIOGRAM (Abdomen)  Patient Location: PACU  Anesthesia Type:General  Level of Consciousness: awake  Airway & Oxygen Therapy: Patient Spontanous Breathing and Patient connected to nasal cannula oxygen  Post-op Assessment: Report given to RN and Post -op Vital signs reviewed and stable  Post vital signs: Reviewed and stable  Last Vitals:  Vitals Value Taken Time  BP 119/57 05/22/21 1715  Temp 36.4 C 05/22/21 1707  Pulse 74 05/22/21 1727  Resp 12 05/22/21 1727  SpO2 100 % 05/22/21 1727  Vitals shown include unvalidated device data.  Last Pain:  Vitals:   05/22/21 1715  TempSrc:   PainSc: 0-No pain      Patients Stated Pain Goal: 0 (05/22/21 1336)  Complications: No notable events documented.

## 2021-05-22 NOTE — Op Note (Signed)
Preoperative diagnosis: Acute cholecystitis and jaundice  Postoperative diagnosis: Cholecystitis  Procedure: Robotic Assisted Laparoscopic Cholecystectomy.   Anesthesia: GETA   Surgeon: Dr. Windell Moment  Wound Classification: Clean Contaminated  Indications: Patient is a 66 y.o. female developed right upper quadrant pain and on workup was found to have cholelithiasis with a dilated common duct.  MRCP negative for choledocholithiasis but bilirubin was in increasing trend. Robotic Assisted Laparoscopic cholecystectomy with intra operative cholangiogram was elected.  Findings: Critical view of safety achieved Cystic duct and artery identified, ligated and divided No filling defect identified on cholangiogram Adequate hemostasis    Description of procedure: The patient was placed on the operating table in the supine position. General anesthesia was induced. A time-out was completed verifying correct patient, procedure, site, positioning, and implant(s) and/or special equipment prior to beginning this procedure. An orogastric tube was placed. The abdomen was prepped and draped in the usual sterile fashion.  An incision was made in a natural skin line below the umbilicus.  The fascia was elevated and the Veress needle inserted. Proper position was confirmed by aspiration and saline meniscus test.  The abdomen was insufflated with carbon dioxide to a pressure of 15 mmHg. The patient tolerated insufflation well. A 8-mm trocar was then inserted in optiview fashion.  The laparoscope was inserted and the abdomen inspected. No injuries from initial trocar placement were noted. Additional trocars were then inserted in the following locations: an 8-mm trocar in the left lateral abdomen, and another two 8-mm trocars to the right side of the abdomen 5 cm appart. The umbilical trocar was changed to a 12 mm trocar all under direct visualization. Angio cath was inserted in right upper quadrant. The abdomen  was inspected and no abnormalities were found. The table was placed in the reverse Trendelenburg position with the right side up. The robotic arms were docked and target anatomy identified. Instrument inserted under direct visualization.  Filmy adhesions between the gallbladder and omentum, duodenum and transverse colon were lysed with electrocautery. The dome of the gallbladder was grasped with a prograsp and retracted over the dome of the liver. The infundibulum was also grasped with an atraumatic grasper and retracted toward the right lower quadrant. This maneuver exposed Calots triangle. The peritoneum overlying the gallbladder infundibulum was then incised and the cystic duct and cystic artery identified and circumferentially dissected. Critical view of safety reviewed before ligating any structure. Firefly images taken to visualize biliary ducts.  A large clip was placed as close to the cystic duct and gallbladder as possible. Small cystic ductotomy was done. Catheter was inserted through angio cath and into the cystic duct. Bile was aspirated and saline flushed without resistance. Contrast was then flushed and fluoroscopy images were reviewed. No contrast extravasation or filling defect was identified. Catheter removed.   The cystic duct and cystic artery were then doubly clipped and divided close to the gallbladder.  The gallbladder was then dissected from its peritoneal attachments by electrocautery. Hemostasis was checked and the gallbladder and contained stones were removed using an endoscopic retrieval bag. The gallbladder was passed off the table as a specimen. There was no evidence of bleeding from the gallbladder fossa or cystic artery or leakage of the bile from the cystic duct stump. Secondary trocars were removed under direct vision. No bleeding was noted. The robotic arms were undoked. The scope was withdrawn and the umbilical trocar removed. The abdomen was allowed to collapse. The fascia of  the 72mm trocar sites was closed with  figure-of-eight 0 vicryl sutures. The skin was closed with subcuticular sutures of 4-0 monocryl and topical skin adhesive. The orogastric tube was removed.  The patient tolerated the procedure well and was taken to the postanesthesia care unit in stable condition.   Specimen: Gallbladder  Complications: None  EBL: 5 mL

## 2021-05-22 NOTE — Anesthesia Procedure Notes (Signed)
Procedure Name: Intubation Date/Time: 05/22/2021 2:43 PM Performed by: Beverely Low, CRNA Pre-anesthesia Checklist: Patient identified, Patient being monitored, Timeout performed, Emergency Drugs available and Suction available Patient Re-evaluated:Patient Re-evaluated prior to induction Oxygen Delivery Method: Circle system utilized Preoxygenation: Pre-oxygenation with 100% oxygen Induction Type: IV induction Ventilation: Mask ventilation without difficulty Laryngoscope Size: McGraph and 4 Grade View: Grade I Tube type: Oral Tube size: 7.0 mm Number of attempts: 1 Airway Equipment and Method: Stylet Placement Confirmation: ETT inserted through vocal cords under direct vision, positive ETCO2 and breath sounds checked- equal and bilateral Secured at: 21 cm Tube secured with: Tape Dental Injury: Teeth and Oropharynx as per pre-operative assessment

## 2021-05-22 NOTE — Anesthesia Preprocedure Evaluation (Signed)
Anesthesia Evaluation  Patient identified by MRN, date of birth, ID band Patient awake    Reviewed: Allergy & Precautions, H&P , NPO status , Patient's Chart, lab work & pertinent test results, reviewed documented beta blocker date and time   History of Anesthesia Complications (+) PONV and history of anesthetic complications  Airway Mallampati: II  TM Distance: >3 FB Neck ROM: full    Dental  (+) Dental Advidsory Given, Teeth Intact   Pulmonary neg pulmonary ROS,    Pulmonary exam normal breath sounds clear to auscultation       Cardiovascular Exercise Tolerance: Good hypertension, (-) angina(-) Past MI and (-) Cardiac Stents Normal cardiovascular exam(-) dysrhythmias (-) Valvular Problems/Murmurs Rhythm:regular Rate:Normal     Neuro/Psych negative neurological ROS  negative psych ROS   GI/Hepatic negative GI ROS, Neg liver ROS,   Endo/Other  negative endocrine ROS  Renal/GU negative Renal ROS  negative genitourinary   Musculoskeletal   Abdominal   Peds  Hematology negative hematology ROS (+)   Anesthesia Other Findings Past Medical History: No date: Hypertension No date: PONV (postoperative nausea and vomiting)   Reproductive/Obstetrics negative OB ROS                             Anesthesia Physical Anesthesia Plan  ASA: 2  Anesthesia Plan: General   Post-op Pain Management:    Induction: Intravenous  PONV Risk Score and Plan: 4 or greater and Aprepitant, Ondansetron, Dexamethasone, Midazolam and Treatment may vary due to age or medical condition  Airway Management Planned: Oral ETT  Additional Equipment:   Intra-op Plan:   Post-operative Plan: Extubation in OR  Informed Consent: I have reviewed the patients History and Physical, chart, labs and discussed the procedure including the risks, benefits and alternatives for the proposed anesthesia with the patient or  authorized representative who has indicated his/her understanding and acceptance.     Dental Advisory Given  Plan Discussed with: Anesthesiologist, CRNA and Surgeon  Anesthesia Plan Comments:         Anesthesia Quick Evaluation

## 2021-05-22 NOTE — H&P (Signed)
SURGICAL CONSULTATION NOTE   HISTORY OF PRESENT ILLNESS (HPI):  66 y.o. female presented to [facility] ED for evaluation of abdominal pain. Patient reports she has been having intermittent abdominal pain.  He has been evaluated by PCP.  Upon evaluation by the PCP she was found with elevated liver enzymes.  She had an ultrasound of the abdomen that shows cholelithiasis with normal common bile duct of up to 0.6 cm.  Repeated outpatient labs shows improvement of the liver enzymes and bilirubin.  Last night she had another attack in the right upper quadrant.  She was found again with elevated bilirubin and liver enzymes.  She had MRCP done that shows no sign of filling defect.  Due to the persistent pain she was admitted for cholecystectomy.  Labs were repeated this morning and bilirubin is even higher to 2.6.  I personally evaluated the images of the abdominal ultrasound and MRCP.   PAST MEDICAL HISTORY (PMH):  Past Medical History:  Diagnosis Date   Hypertension      PAST SURGICAL HISTORY ([place]):  No past surgical history on file.   MEDICATIONS:  Prior to Admission medications   Medication Sig Start Date End Date Taking? Authorizing Provider  atenolol (TENORMIN) 50 MG tablet Take 50 mg by mouth daily.   Yes [provider]  Cholecalciferol 50 MCG (2000 UT) TABS Take 1 tablet by mouth in the morning and at bedtime.   Yes [provider]  levothyroxine (SYNTHROID) 100 MCG tablet Take 100 mcg by mouth daily before breakfast.   Yes [provider]     ALLERGIES:  Allergies  Allergen Reactions   Codeine    Sulfa Antibiotics Hives     SOCIAL HISTORY:  Social History   Socioeconomic History   Marital status: Married    Spouse name: Not on file   Number of children: Not on file   Years of education: Not on file   Highest education level: Not on file  Occupational History   Not on file  Tobacco Use   Smoking status: Never   Smokeless tobacco: Never  Substance  and Sexual Activity   Alcohol use: Yes    Comment: occ   Drug use: Never   Sexual activity: Not on file  Other Topics Concern   Not on file  Social History Narrative   Not on file   Social Determinants of Health   Financial Resource Strain: Not on file  Food Insecurity: Not on file  Transportation Needs: Not on file  Physical Activity: Not on file  Stress: Not on file  Social Connections: Not on file  Intimate Partner Violence: Not on file     FAMILY HISTORY:  No family history on file.   REVIEW OF SYSTEMS:  Constitutional: denies weight loss, fever, chills, or sweats  Eyes: denies any other vision changes, history of eye injury  ENT: denies sore throat, hearing problems  Respiratory: denies shortness of breath, wheezing  Cardiovascular: denies chest pain, palpitations  Gastrointestinal: positive abdominal pain, Denies nausea and vomitnig Genitourinary: denies burning with urination or urinary frequency Musculoskeletal: denies any other joint pains or cramps  Skin: denies any other rashes or skin discolorations  Neurological: denies any other headache, dizziness, weakness  Psychiatric: denies any other depression, anxiety   All other review of systems were negative   VITAL SIGNS:  Temp:  [97.5 F (36.4 C)-98.2 F (36.8 C)] 97.5 F (36.4 C) (01/27 0918) Pulse Rate:  [52-63] 52 (01/27 0918) Resp:  [16-18]  16 (01/27 0918) BP: (138-162)/(62-73) 162/73 (01/27 0918) SpO2:  [97 %-100 %] 98 % (01/27 0918) Weight:  [81.6 kg] 81.6 kg (01/26 2043)     Height: 5\' 6"  (167.6 cm) Weight: 81.6 kg BMI (Calculated): 29.07   INTAKE/OUTPUT:  This shift: No intake/output data recorded.  Last 2 shifts: @IOLAST2SHIFTS @   PHYSICAL EXAM:  Constitutional:  -- Normal body habitus  -- Awake, alert, and oriented x3  Eyes:  -- Pupils equally round and reactive to light  -- No scleral icterus  Ear, nose, and throat:  -- No jugular venous distension  Pulmonary:  -- No crackles  --  Equal breath sounds bilaterally -- Breathing non-labored at rest Cardiovascular:  -- S1, S2 present  -- No pericardial rubs Gastrointestinal:  -- Abdomen soft, tender in the right upper quadrant, non-distended, no guarding or rebound tenderness -- No abdominal masses appreciated, pulsatile or otherwise  Musculoskeletal and Integumentary:  -- Wounds: None appreciated -- Extremities: B/L UE and LE FROM, hands and feet warm, no edema  Neurologic:  -- Motor function: intact and symmetric -- Sensation: intact and symmetric   Labs:  CBC Latest Ref Rng & Units 05/22/2021 05/21/2021  WBC 4.0 - 10.5 K/uL 5.0 7.8  Hemoglobin 12.0 - 15.0 g/dL 13.2 12.6  Hematocrit 36.0 - 46.0 % 40.2 38.5  Platelets 150 - 400 K/uL 248 261   CMP Latest Ref Rng & Units 05/22/2021 05/21/2021  Glucose 70 - 99 mg/dL 85 145(H)  BUN 8 - 23 mg/dL 12 16  Creatinine 0.44 - 1.00 mg/dL 0.69 0.79  Sodium 135 - 145 mmol/L 139 139  Potassium 3.5 - 5.1 mmol/L 3.9 4.0  Chloride 98 - 111 mmol/L 105 103  CO2 22 - 32 mmol/L 25 26  Calcium 8.9 - 10.3 mg/dL 8.8(L) 9.3  Total Protein 6.5 - 8.1 g/dL 6.9 7.0  Total Bilirubin 0.3 - 1.2 mg/dL 2.4(H) 1.6(H)  Alkaline Phos 38 - 126 U/L 184(H) 157(H)  AST 15 - 41 U/L 526(H) 308(H)  ALT 0 - 44 U/L 466(H) 319(H)    Imaging studies:  EXAM: MRI ABDOMEN WITHOUT CONTRAST  (INCLUDING MRCP)   TECHNIQUE: Multiplanar multisequence MR imaging of the abdomen was performed. Heavily T2-weighted images of the biliary and pancreatic ducts were obtained, and three-dimensional MRCP images were rendered by post processing.   COMPARISON:  Ultrasounds performed yesterday and 05/19/2021 , both showing cholelithiasis and findings consistent with adenomyomatosis of the gallbladder, neither demonstrating evidence of pericholecystic fluid or positive sonographic Murphy's sign   FINDINGS: Factors affecting image quality: Some sequences are compromised by patient motion.   Lower chest: Heart is  slightly enlarged. There is a trace pericardial effusion. There is no dense lung base consolidation or pleural effusion.   Hepatobiliary: 16 cm in length mildly steatotic liver. No mass is seen without contrast. The gallbladder is mildly distended, up to 8.1 cm in length. There are several stones in the proximal lumen, largest of these is 1.5 cm, but no impacted stone is seen.   In the gallbladder fundus, there is mild mural thickening with multiple tiny rounded fluid-filled intramural cavities consistent with adenomyomatosis, but no pericholecystic fluid is seen. There are few tiny stones in the fundus as well.   The common bile duct is slightly prominent at 7.1 mm but it does not show evidence stones or stricturing and there is no intrahepatic biliary prominence and no beading or pruning of the intrahepatic biliary radicles is seen.   Pancreas: Partially atrophic without visible mass,  ductal dilatation or adjacent edema.   Spleen:  No masses seen without contrast.  No splenomegaly.   Adrenals/Urinary Tract: No adrenal or renal cortical mass or cysts are observed. There is no hydronephrosis. The proximal ureters are decompressed.   Stomach/Bowel: Visualized portions within the abdomen are unremarkable.   Vascular/Lymphatic: No pathologically enlarged lymph nodes identified. No abdominal aortic aneurysm demonstrated.   Other:  There is no free ascites.  No mesenteric edema.   Musculoskeletal: There is degenerative disc disease with spondylosis from L3-4 down, greatest at L3-4, slight lumbar levoscoliosis. No worrisome regional bone lesions.   IMPRESSION: 1. Cholelithiasis, with a mildly distended gallbladder with fundal wall prominence and multiple tiny cystic spaces in the fundal wall consistent with Rokitansky-Aschoff sinuses of adenomyomatosis. There is no pericholecystic fluid. 2. 7.1 mm slightly prominent common bile duct but no ductal filling defects or  intrahepatic biliary prominence. 3. No pancreatic ductal dilatation or pancreatic inflammatory changes. Partial pancreatic atrophy. 4. Slightly prominent heart with trace pericardial effusion. 5. 16 cm in length mildly steatotic liver. No focal mass is seen without contrast. 6. Both adrenal glands and kidneys unremarkable without contrast. No splenomegaly.     Electronically Signed   By: Telford Nab M.D.   On: 05/22/2021 02:44  Assessment/Plan:  66 y.o. female with acute cholecystitis, complicated by pertinent comorbidities including elevated liver enzymes concerning for choledocholithiasis, hypertension.  Patient with history, physical exam and images consistent with acute cholecystitis.  Despite not having classic sign of cholecystitis on images, clinically patient with recurrent pain and tender to palpation in the right upper quadrant.  This is consistent with cholecystitis.  Also with elevated bilirubin with negative MRCP and still concern of possible missed stone on the common bile duct.  I discussed with patient adding cholangiogram intraoperatively to rule out choledocholithiasis.  Patient oriented about diagnosis and surgical management as treatment.   Discussed the risk of surgery including post-op infxn, seroma, biloma, chronic pain, poor-delayed wound healing, retained gallstone, conversion to open procedure, post-op SBO or ileus, and need for additional procedures to address said risks.  The risks of general anesthetic including MI, CVA, sudden death or even reaction to anesthetic medications also discussed. Alternatives include continued observation.  Benefits include possible symptom relief, prevention of complications including acute cholecystitis, pancreatitis.  Arnold Long, MD

## 2021-05-22 NOTE — ED Provider Notes (Signed)
----------------------------------------- °  3:02 AM on 05/22/2021 -----------------------------------------   Updated patient and family member of MRCP results which does not show obstructing stone.  This is patient's second attack within several days, with escalating LFTs.  Will discuss case with surgeon on-call Dr. Hampton Abbot.  MRCP interpreted per Dr. Vanita Panda:  1. Cholelithiasis, with a mildly distended gallbladder with fundal  wall prominence and multiple tiny cystic spaces in the fundal wall  consistent with Rokitansky-Aschoff sinuses of adenomyomatosis. There  is no pericholecystic fluid.  2. 7.1 mm slightly prominent common bile duct but no ductal filling  defects or intrahepatic biliary prominence.  3. No pancreatic ductal dilatation or pancreatic inflammatory  changes. Partial pancreatic atrophy.  4. Slightly prominent heart with trace pericardial effusion.  5. 16 cm in length mildly steatotic liver. No focal mass is seen  without contrast.  6. Both adrenal glands and kidneys unremarkable without contrast. No  splenomegaly.   Discussed case with Dr. Hampton Abbot who reviewed patient's laboratory values and imaging studies from the week as well as tonight.  He will admit patient to the hospital.   Paulette Blanch, MD 05/22/21 [phone]

## 2021-05-22 NOTE — ED Notes (Signed)
Informed RN bed assigned 

## 2021-05-22 NOTE — Anesthesia Postprocedure Evaluation (Signed)
Anesthesia Post Note  Patient: Leslie Hale  Procedure(s) Performed: XI ROBOTIC ASSISTED LAPAROSCOPIC CHOLECYSTECTOMY (Abdomen) INDOCYANINE GREEN FLUORESCENCE IMAGING (ICG) (Abdomen) INTRAOPERATIVE CHOLANGIOGRAM (Abdomen)  Patient location during evaluation: PACU Anesthesia Type: General Level of consciousness: awake and alert Pain management: pain level controlled Vital Signs Assessment: post-procedure vital signs reviewed and stable Respiratory status: spontaneous breathing, nonlabored ventilation and respiratory function stable Cardiovascular status: blood pressure returned to baseline and stable Postop Assessment: no apparent nausea or vomiting Anesthetic complications: no   No notable events documented.   Last Vitals:  Vitals:   05/22/21 1749 05/22/21 1843  BP:  134/76  Pulse: 68 (!) 59  Resp: 16 16  Temp: (!) 36.2 C (!) 36.3 C  SpO2: 99% 98%    Last Pain:  Vitals:   05/22/21 1843  TempSrc: Oral  PainSc:                  Leslie Hale

## 2021-05-22 NOTE — ED Notes (Signed)
Rn to bedside to introduce self to pt. Pt CAOx4 and in no distress.  °

## 2021-05-22 NOTE — Progress Notes (Signed)
Initial Nutrition Assessment  DOCUMENTATION CODES:   Not applicable  INTERVENTION:   -RD will follow for diet advancement and add supplement as appropriate  NUTRITION DIAGNOSIS:   Inadequate oral intake related to altered GI function as evidenced by NPO status.  GOAL:   Patient will meet greater than or equal to 90% of their needs  MONITOR:   PO intake, Supplement acceptance, Diet advancement, Labs, Weight trends, Skin, I & O's  REASON FOR ASSESSMENT:   Malnutrition Screening Tool    ASSESSMENT:   66 y.o. female presented to [facility] ED for evaluation of abdominal pain. Patient reports she has been having intermittent abdominal pain.  He has been evaluated by PCP.  Upon evaluation by the PCP she was found with elevated liver enzymes.  She had an ultrasound of the abdomen that shows cholelithiasis with normal common bile duct of up to 0.6 cm.  Repeated outpatient labs shows improvement of the liver enzymes and bilirubin.  Last night she had another attack in the right upper quadrant.  She was found again with elevated bilirubin and liver enzymes.  She had MRCP done that shows no sign of filling defect.  Due to the persistent pain she was admitted for cholecystectomy.  Pt admitted with acute cholecystitis.   Pt unavailable at time of visit. Pt down in OR at time of visit. RD unable to obtain further nutrition-related history or complete nutrition-focused physical exam at this time.    Pt currently NPO for procedure.   Reviewed wt hx; pt wt has been stable over the past 4 months, however, suspect most recent wt has a stated weight instead of a measured wt.   Pt with increased nutritional needs for post-operative healing and would benefit from addition of oral nutrition supplements.   Medications reviewed and include lactated ringers infusion @ 100 ml/hr.   Labs reviewed.   Diet Order:   Diet Order             Diet NPO time specified Except for: Sips with Meds  Diet effective  now                   EDUCATION NEEDS:   No education needs have been identified at this time  Skin:  Skin Assessment: Reviewed RN Assessment  Last BM:  05/22/21  Height:   Ht Readings from Last 1 Encounters:  05/22/21 5\' 6"  (1.676 m)    Weight:   Wt Readings from Last 1 Encounters:  05/22/21 81.6 kg    Ideal Body Weight:  59.1 kg  BMI:  Body mass index is 29.04 kg/m.  Estimated Nutritional Needs:   Kcal:  1750-1950  Protein:  90-105 grams  Fluid:  > 1.7 L    05/24/21, RD, LDN, CDCES Registered Dietitian II Certified Diabetes Care and Education Specialist Please refer to [facility] for RD and/or RD on-call/weekend/after hours pager

## 2021-05-23 ENCOUNTER — Encounter: Payer: Self-pay | Admitting: General Surgery

## 2021-05-23 LAB — HEPATIC FUNCTION PANEL
ALT: 263 U/L — ABNORMAL HIGH (ref 0–44)
AST: 163 U/L — ABNORMAL HIGH (ref 15–41)
Albumin: 3.2 g/dL — ABNORMAL LOW (ref 3.5–5.0)
Alkaline Phosphatase: 135 U/L — ABNORMAL HIGH (ref 38–126)
Bilirubin, Direct: 0.3 mg/dL — ABNORMAL HIGH (ref 0.0–0.2)
Indirect Bilirubin: 0.5 mg/dL (ref 0.3–0.9)
Total Bilirubin: 0.8 mg/dL (ref 0.3–1.2)
Total Protein: 6 g/dL — ABNORMAL LOW (ref 6.5–8.1)

## 2021-05-23 NOTE — Progress Notes (Signed)
Patient c/o pain at left hand IV site while infusing. Noted that left hand was warm and swollen. Stopped infusion and removed left hand periph line.

## 2021-05-23 NOTE — TOC Initial Note (Signed)
Transition of Care [facility]) - Initial/Assessment Note    Patient Details  Name: Leslie Hale MRN: [id number] Date of Birth: Apr 26, 1956  Transition of Care [facility]) CM/SW Contact:    Conception Oms, RN Phone Number: 05/23/2021, 11:08 AM  Clinical Narrative:                  Transition of Care [facility]) Screening Note   Patient Details  Name: Leslie Hale Date of Birth: [birth year]   Transition of Care [facility]) CM/SW Contact:    Conception Oms, RN Phone Number: 05/23/2021, 11:08 AM    Transition of Care Department [facility]) has reviewed patient and no TOC needs have been identified at this time. We will continue to monitor patient advancement through interdisciplinary progression rounds. If new patient transition needs arise, please place a TOC consult.          Patient Goals and CMS Choice        Expected Discharge Plan and Services           Expected Discharge Date: 05/23/21                                    Prior Living Arrangements/Services                       Activities of Daily Living Home Assistive Devices/Equipment: None ADL Screening (condition at time of admission) Patient's cognitive ability adequate to safely complete daily activities?: Yes Is the patient deaf or have difficulty hearing?: Yes Does the patient have difficulty seeing, even when wearing glasses/contacts?: No Does the patient have difficulty concentrating, remembering, or making decisions?: No Patient able to express need for assistance with ADLs?: Yes Does the patient have difficulty dressing or bathing?: No Independently performs ADLs?: Yes (appropriate for developmental age) Does the patient have difficulty walking or climbing stairs?: No Weakness of Legs: None Weakness of Arms/Hands: None  Permission Sought/Granted                  Emotional Assessment              Admission diagnosis:  Elevated LFTs [R79.89] Symptomatic cholelithiasis  [K80.20] Right upper quadrant abdominal pain [R10.11] Calculus of gallbladder with biliary obstruction but without cholecystitis [K80.21] Patient Active Problem List   Diagnosis Date Noted   Symptomatic cholelithiasis 05/22/2021   PCP:  Leonel Ramsay, MD Pharmacy:   [id number] - [address], [place] [address] [place] [address] Phone: [phone] Fax: [phone]     Social Determinants of Health (SDOH) Interventions    Readmission Risk Interventions No flowsheet data found.

## 2021-05-23 NOTE — Discharge Instructions (Signed)

## 2021-05-23 NOTE — Discharge Summary (Addendum)
°  Patient ID: Leslie Hale MRN: [id number] DOB/AGE: May 29, 1955 66 y.o.  Admit date: 05/21/2021 Discharge date: 05/23/2021   Discharge Diagnoses:  Principal Problem:   Symptomatic cholelithiasis   Procedures:Robotic assisted laparoscopic cholecystectomy with intra operative cholangiogram  Hospital Course: Patient with acute cholecystitis and jaundice. MRCP negative for choledocholithiasis. Due to increasing trend of bilirubin, cholecystectomy was done with intra operative cholangiogram. Cholangiogram negative for filling defect. Bilirubin today normal limits. Liver enzymes in decreasing trend. Pain controlled. Tolerating diet. Wounds are dry and clean.   Physical Exam Cardiovascular:     Rate and Rhythm: Normal rate and regular rhythm.  Pulmonary:     Effort: Pulmonary effort is normal.  Abdominal:     General: Abdomen is flat. Bowel sounds are normal.     Palpations: Abdomen is soft.  Skin:    General: Skin is warm.     Capillary Refill: Capillary refill takes less than 2 seconds.  Neurological:     Mental Status: She is alert and oriented to person, place, and time.    Consults: None  Disposition: Discharge disposition: 01-Home or Self Care       Discharge Instructions     Diet - low sodium heart healthy   Complete by: As directed    Increase activity slowly   Complete by: As directed       Allergies as of 05/23/2021       Reactions   Codeine    Sulfa Antibiotics Hives        Medication List     TAKE these medications    atenolol 50 MG tablet Commonly known as: TENORMIN Take 50 mg by mouth daily.   Cholecalciferol 50 MCG (2000 UT) Tabs Take 1 tablet by mouth in the morning and at bedtime.   levothyroxine 100 MCG tablet Commonly known as: SYNTHROID Take 100 mcg by mouth daily before breakfast.   traMADol 50 MG tablet Commonly known as: Ultram Take 1 tablet (50 mg total) by mouth every 6 (six) hours as needed.        Follow-up  Information     Carolan Shiver, MD Follow up in 2 week(s).   Specialty: General Surgery Why: folow up after cholecystectomy Contact information: [address] [place] [address] [phone]

## 2021-05-26 LAB — SURGICAL PATHOLOGY

## 2021-12-01 ENCOUNTER — Ambulatory Visit
Admission: RE | Admit: 2021-12-01 | Discharge: 2021-12-01 | Disposition: A | Discharge status: Home / Self Care | Payer: Medicare Other | Source: Ambulatory Visit | Attending: Infectious Diseases | Admitting: Infectious Diseases

## 2021-12-01 DIAGNOSIS — Z1231 Encounter for screening mammogram for malignant neoplasm of breast: Secondary | ICD-10-CM | POA: Diagnosis present

## 2021-12-03 ENCOUNTER — Other Ambulatory Visit: Payer: Self-pay | Admitting: Infectious Diseases

## 2021-12-03 DIAGNOSIS — R928 Other abnormal and inconclusive findings on diagnostic imaging of breast: Secondary | ICD-10-CM

## 2021-12-03 DIAGNOSIS — R921 Mammographic calcification found on diagnostic imaging of breast: Secondary | ICD-10-CM

## 2021-12-03 DIAGNOSIS — N6489 Other specified disorders of breast: Secondary | ICD-10-CM

## 2021-12-23 ENCOUNTER — Ambulatory Visit
Admission: RE | Admit: 2021-12-23 | Discharge: 2021-12-23 | Disposition: A | Discharge status: Home / Self Care | Payer: Medicare Other | Source: Ambulatory Visit | Attending: Infectious Diseases | Admitting: Infectious Diseases

## 2021-12-23 DIAGNOSIS — R921 Mammographic calcification found on diagnostic imaging of breast: Secondary | ICD-10-CM | POA: Diagnosis present

## 2021-12-23 DIAGNOSIS — R928 Other abnormal and inconclusive findings on diagnostic imaging of breast: Secondary | ICD-10-CM | POA: Insufficient documentation

## 2021-12-23 DIAGNOSIS — N6489 Other specified disorders of breast: Secondary | ICD-10-CM | POA: Diagnosis present

## 2021-12-25 ENCOUNTER — Other Ambulatory Visit: Payer: Self-pay | Admitting: Infectious Diseases

## 2021-12-25 DIAGNOSIS — R928 Other abnormal and inconclusive findings on diagnostic imaging of breast: Secondary | ICD-10-CM

## 2021-12-25 DIAGNOSIS — R921 Mammographic calcification found on diagnostic imaging of breast: Secondary | ICD-10-CM

## 2022-01-12 ENCOUNTER — Ambulatory Visit
Admission: RE | Admit: 2022-01-12 | Discharge: 2022-01-12 | Disposition: A | Discharge status: Home / Self Care | Payer: Medicare Other | Source: Ambulatory Visit | Attending: Infectious Diseases | Admitting: Infectious Diseases

## 2022-01-12 DIAGNOSIS — R928 Other abnormal and inconclusive findings on diagnostic imaging of breast: Secondary | ICD-10-CM | POA: Diagnosis not present

## 2022-01-12 DIAGNOSIS — R921 Mammographic calcification found on diagnostic imaging of breast: Secondary | ICD-10-CM | POA: Diagnosis present

## 2022-01-12 HISTORY — PX: BREAST BIOPSY: SHX20

## 2022-01-13 LAB — SURGICAL PATHOLOGY

## 2022-11-18 ENCOUNTER — Other Ambulatory Visit: Payer: Self-pay | Admitting: Infectious Diseases

## 2022-11-18 DIAGNOSIS — G4733 Obstructive sleep apnea (adult) (pediatric): Secondary | ICD-10-CM

## 2022-11-18 DIAGNOSIS — I10 Essential (primary) hypertension: Secondary | ICD-10-CM

## 2022-11-18 DIAGNOSIS — E890 Postprocedural hypothyroidism: Secondary | ICD-10-CM

## 2022-11-18 DIAGNOSIS — E785 Hyperlipidemia, unspecified: Secondary | ICD-10-CM

## 2022-11-18 DIAGNOSIS — I517 Cardiomegaly: Secondary | ICD-10-CM

## 2022-11-29 ENCOUNTER — Ambulatory Visit
Admission: RE | Admit: 2022-11-29 | Discharge: 2022-11-29 | Disposition: A | Discharge status: Home / Self Care | Payer: Self-pay | Source: Ambulatory Visit | Attending: Infectious Diseases | Admitting: Infectious Diseases

## 2022-11-29 DIAGNOSIS — E890 Postprocedural hypothyroidism: Secondary | ICD-10-CM | POA: Insufficient documentation

## 2022-11-29 DIAGNOSIS — G4733 Obstructive sleep apnea (adult) (pediatric): Secondary | ICD-10-CM | POA: Insufficient documentation

## 2022-11-29 DIAGNOSIS — I517 Cardiomegaly: Secondary | ICD-10-CM | POA: Insufficient documentation

## 2022-11-29 DIAGNOSIS — E785 Hyperlipidemia, unspecified: Secondary | ICD-10-CM | POA: Insufficient documentation

## 2022-11-29 DIAGNOSIS — I10 Essential (primary) hypertension: Secondary | ICD-10-CM | POA: Insufficient documentation

## 2023-02-04 ENCOUNTER — Encounter: Payer: Self-pay | Admitting: Emergency Medicine

## 2023-02-04 ENCOUNTER — Other Ambulatory Visit: Payer: Self-pay

## 2023-02-04 ENCOUNTER — Emergency Department
Admission: EM | Admit: 2023-02-04 | Discharge: 2023-02-04 | Disposition: A | Discharge status: Home / Self Care | Payer: Medicare Other | Attending: Emergency Medicine | Admitting: Emergency Medicine

## 2023-02-04 ENCOUNTER — Emergency Department: Payer: Medicare Other

## 2023-02-04 DIAGNOSIS — N838 Other noninflammatory disorders of ovary, fallopian tube and broad ligament: Secondary | ICD-10-CM

## 2023-02-04 DIAGNOSIS — I10 Essential (primary) hypertension: Secondary | ICD-10-CM | POA: Insufficient documentation

## 2023-02-04 DIAGNOSIS — N83202 Unspecified ovarian cyst, left side: Secondary | ICD-10-CM | POA: Diagnosis not present

## 2023-02-04 DIAGNOSIS — K649 Unspecified hemorrhoids: Secondary | ICD-10-CM | POA: Insufficient documentation

## 2023-02-04 DIAGNOSIS — K625 Hemorrhage of anus and rectum: Secondary | ICD-10-CM | POA: Insufficient documentation

## 2023-02-04 LAB — HEPATIC FUNCTION PANEL
ALT: 18 U/L (ref 0–44)
AST: 25 U/L (ref 15–41)
Albumin: 4.5 g/dL (ref 3.5–5.0)
Alkaline Phosphatase: 42 U/L (ref 38–126)
Bilirubin, Direct: <0.1 mg/dL (ref 0.0–0.2)
Total Bilirubin: 0.6 mg/dL (ref 0.3–1.2)
Total Protein: 7.7 g/dL (ref 6.5–8.1)

## 2023-02-04 LAB — CBC
HCT: 41.9 % (ref 36.0–46.0)
Hemoglobin: 13.6 g/dL (ref 12.0–15.0)
MCH: 31.8 pg (ref 26.0–34.0)
MCHC: 32.5 g/dL (ref 30.0–36.0)
MCV: 97.9 fL (ref 80.0–100.0)
Platelets: 272 10*3/uL (ref 150–400)
RBC: 4.28 MIL/uL (ref 3.87–5.11)
RDW: 13.4 % (ref 11.5–15.5)
WBC: 5.8 10*3/uL (ref 4.0–10.5)
nRBC: 0 % (ref 0.0–0.2)

## 2023-02-04 LAB — BASIC METABOLIC PANEL
Anion gap: 13 (ref 5–15)
BUN: 23 mg/dL (ref 8–23)
CO2: 24 mmol/L (ref 22–32)
Calcium: 8.9 mg/dL (ref 8.9–10.3)
Chloride: 102 mmol/L (ref 98–111)
Creatinine, Ser: 0.73 mg/dL (ref 0.44–1.00)
GFR, Estimated: >60 mL/min (ref >60)
Glucose, Bld: 115 mg/dL — ABNORMAL HIGH (ref 70–99)
Potassium: 4.5 mmol/L (ref 3.5–5.1)
Sodium: 139 mmol/L (ref 135–145)

## 2023-02-04 LAB — TYPE AND SCREEN
ABO/RH(D): A POS
Antibody Screen: NEGATIVE

## 2023-02-04 MED ORDER — IOHEXOL 350 MG/ML SOLN
100.0000 mL | Freq: Once | INTRAVENOUS | Status: AC | PRN
  Administered 2023-02-04: 100 mL via INTRAVENOUS

## 2023-02-04 NOTE — ED Provider Notes (Signed)
[facility] Provider Note    Event Date/Time   First MD Initiated Contact with Patient 02/04/23 1239     (approximate)   History   Rectal Bleeding   HPI  Leslie Hale is a 67 y.o. female no significant past medical history who presents to the emergency department for rectal bleeding.  States that she noted bright red blood per rectum after having a bowel movement today.  States that she noted a couple of "tissue products" that came out after she had blood in her stool.  She is uncertain of what they were but states that they were firm.  Does have a history of hemorrhoids but states she has not had problems with bleeding from her MRIs that have been similar.  Denies any abdominal pain.  Colonoscopy that was done 4 years ago that showed polyps and she is due for a repeat colonoscopy that is scheduled for the end of November.  Not on any anticoagulation.  Does take omeprazole for gastritis/peptic ulcer disease.  Denies any nausea or vomiting.  No fever or chills.  No melena in her stool.  No falls or trauma.  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  Denies any chest pain or shortness of breath.     Physical Exam   Triage Vital Signs: ED Triage Vitals  Encounter Vitals Group     BP 02/04/23 0958 (!) 196/83     Systolic BP Percentile --      Diastolic BP Percentile --      Pulse Rate 02/04/23 0958 (!) 52     Resp 02/04/23 0958 20     Temp 02/04/23 0958 98.3 F (36.8 C)     Temp Source 02/04/23 0958 Oral     SpO2 02/04/23 0958 98 %     Weight 02/04/23 0956 165 lb (74.8 kg)     Height 02/04/23 0956 5' 4.5" (1.638 m)     Head Circumference --      Peak Flow --      Pain Score 02/04/23 0956 0     Pain Loc --      Pain Education --      Exclude from Growth Chart --     Most recent vital signs: Vitals:   02/04/23 0958 02/04/23 1255  BP: (!) 196/83 (!) 192/74  Pulse: (!) 52 61  Resp: 20 18  Temp: 98.3 F  (36.8 C)   SpO2: 98% 97%    Physical Exam Constitutional:      Appearance: She is well-developed.  HENT:     Head: Atraumatic.  Eyes:     Conjunctiva/sclera: Conjunctivae normal.  Cardiovascular:     Rate and Rhythm: Regular rhythm.  Pulmonary:     Effort: No respiratory distress.  Abdominal:     General: There is no distension.     Tenderness: There is no abdominal tenderness.  Genitourinary:    Rectum: Internal hemorrhoid present. No mass, tenderness, anal fissure or external hemorrhoid.  Musculoskeletal:        General: Normal range of motion.     Cervical back: Normal range of motion.  Skin:    General: Skin is warm.  Neurological:     Mental Status: She is alert. Mental status is at baseline.     IMPRESSION / MDM / ASSESSMENT AND PLAN / ED COURSE  I reviewed the triage vital signs and the nursing notes.  Differential diagnosis including internal  hemorrhoids, lower GI bleed, diverticulosis, AVM, malignancy.  Have a low suspicion for significant upper GI bleed.  EKG  I, Corena Herter, the attending physician, personally viewed and interpreted this ECG.   Rate: Normal  Rhythm: Normal sinus  Axis: Normal  Intervals: Normal  ST&T Change: None  No tachycardic or bradycardic dysrhythmias while on cardiac telemetry.  RADIOLOGY I independently reviewed imaging, my interpretation of imaging: CT angio abdomen and pelvis -mass to the left ovary or next to the colon  CT scan was read as diverticulosis with a left adnexal cyst/mass and recommended outpatient follow-up ultrasound to further evaluate that was not emergent.  LABS (all labs ordered are listed, but only abnormal results are displayed) Labs interpreted as -    Labs Reviewed  BASIC METABOLIC PANEL - Abnormal; Notable for the following components:      Result Value   Glucose, Bld 115 (*)    All other components within normal limits  CBC  HEPATIC FUNCTION PANEL  TYPE AND SCREEN     MDM  No  significant leukocytosis.  Creatinine is at her baseline.  Normal LFTs.  Normal BUN.  Hemoglobin is stable at 13.6.  No further episodes of GI bleed while in the emergency department.  No bloody diarrhea.  CT scan with findings concerning for possible diverticulosis.  No findings of diverticulitis.  No active bleeding at this time.  Possible internal hemorrhoids.  Low suspicion for significant upper GI bleed.  Discussed incidental finding with the patient of the left adnexal mass and for outpatient follow-up.  Given return precautions for any pain, worsening symptoms or symptoms of anemia.  Discussed close follow-up with primary care physician and her gastroenterologist.  Blood pressure significantly elevated in the emergency department.  Patient does state that she has a history of whitecoat syndrome.  No chest pain or other symptoms of ACS or dissection.  Discussed close follow-up with primary care physician within the next 1 week for recheck of blood pressure and to continue to take her antihypertensive medications.     PROCEDURES:  Critical Care performed: No  Procedures  Patient's presentation is most consistent with acute presentation with potential threat to life or bodily function.   MEDICATIONS ORDERED IN ED: Medications  iohexol (OMNIPAQUE) 350 MG/ML injection 100 mL (100 mLs Intravenous Contrast Given 02/04/23 1401)    FINAL CLINICAL IMPRESSION(S) / ED DIAGNOSES   Final diagnoses:  Rectal bleeding  Mass of left ovary  Uncontrolled hypertension     Rx / DC Orders   ED Discharge Orders     None        Note:  This document was prepared using Dragon voice recognition software and may include unintentional dictation errors.   Corena Herter, MD 02/04/23 1616

## 2023-02-04 NOTE — Discharge Instructions (Signed)
You are seen in the emergency department for rectal bleeding.  Your hemoglobin level was at your normal.  You do not need a blood transfusion.  You did have a CT scan with contrast that showed findings of diverticulosis which can be a cause of GI bleed.  You also had palpable internal hemorrhoids on exam.  Call your gastroenterologist today to schedule a close follow-up appointment and discuss whether they need to move up your colonoscopy any sooner.  You had an incidental finding on CT scan today of a left ovarian mass.  Call your primary care physician or your gynecologist today to schedule an outpatient ultrasound to further evaluate this left ovarian mass/cyst.  Your blood pressure was significantly elevated in the emergency department.  It is important that you follow this up with your primary care physician so they can recheck it and make sure that you do not need an increase of your blood pressure medications.  Return to the emergency department if you have any worsening bleeding, chest pain or shortness of breath.  Thank you for choosing [place] for your health care, it was my pleasure to care for you today!  Corena Herter, MD

## 2023-02-04 NOTE — ED Triage Notes (Signed)
Pt via POV from home. Pt c/o rectal bleeding this AM, states one episode happened this morning. Report bright red blood and "tissue coming out". Reports hx of hemorrhoids but denies pain. Denies blood thinners. Pt is A&Ox4 and NAD, ambulatory to triage.

## 2023-03-10 ENCOUNTER — Encounter: Payer: Self-pay | Admitting: Gastroenterology

## 2023-03-10 ENCOUNTER — Other Ambulatory Visit: Payer: Self-pay

## 2023-03-17 ENCOUNTER — Ambulatory Visit: Payer: Medicare Other | Admitting: Anesthesiology

## 2023-03-17 ENCOUNTER — Ambulatory Visit
Admission: RE | Admit: 2023-03-17 | Discharge: 2023-03-17 | Disposition: A | Discharge status: Home / Self Care | Payer: Medicare Other | Attending: Gastroenterology | Admitting: Gastroenterology

## 2023-03-17 ENCOUNTER — Encounter: Payer: Self-pay | Admitting: Gastroenterology

## 2023-03-17 ENCOUNTER — Encounter
Admission: RE | Disposition: A | Discharge status: Home / Self Care | Payer: Self-pay | Source: Home / Self Care | Attending: Gastroenterology

## 2023-03-17 ENCOUNTER — Other Ambulatory Visit: Payer: Self-pay

## 2023-03-17 DIAGNOSIS — Z79899 Other long term (current) drug therapy: Secondary | ICD-10-CM | POA: Insufficient documentation

## 2023-03-17 DIAGNOSIS — D125 Benign neoplasm of sigmoid colon: Secondary | ICD-10-CM | POA: Insufficient documentation

## 2023-03-17 DIAGNOSIS — K625 Hemorrhage of anus and rectum: Secondary | ICD-10-CM | POA: Insufficient documentation

## 2023-03-17 DIAGNOSIS — K640 First degree hemorrhoids: Secondary | ICD-10-CM | POA: Diagnosis not present

## 2023-03-17 DIAGNOSIS — D123 Benign neoplasm of transverse colon: Secondary | ICD-10-CM | POA: Insufficient documentation

## 2023-03-17 DIAGNOSIS — I10 Essential (primary) hypertension: Secondary | ICD-10-CM | POA: Insufficient documentation

## 2023-03-17 DIAGNOSIS — Z79890 Hormone replacement therapy (postmenopausal): Secondary | ICD-10-CM | POA: Diagnosis not present

## 2023-03-17 DIAGNOSIS — D122 Benign neoplasm of ascending colon: Secondary | ICD-10-CM | POA: Diagnosis not present

## 2023-03-17 DIAGNOSIS — D124 Benign neoplasm of descending colon: Secondary | ICD-10-CM | POA: Diagnosis not present

## 2023-03-17 DIAGNOSIS — G4730 Sleep apnea, unspecified: Secondary | ICD-10-CM | POA: Diagnosis not present

## 2023-03-17 DIAGNOSIS — Z1211 Encounter for screening for malignant neoplasm of colon: Secondary | ICD-10-CM | POA: Insufficient documentation

## 2023-03-17 DIAGNOSIS — K5730 Diverticulosis of large intestine without perforation or abscess without bleeding: Secondary | ICD-10-CM | POA: Diagnosis not present

## 2023-03-17 DIAGNOSIS — D128 Benign neoplasm of rectum: Secondary | ICD-10-CM | POA: Insufficient documentation

## 2023-03-17 DIAGNOSIS — Z860100 Personal history of colon polyps, unspecified: Secondary | ICD-10-CM | POA: Insufficient documentation

## 2023-03-17 DIAGNOSIS — Z9049 Acquired absence of other specified parts of digestive tract: Secondary | ICD-10-CM | POA: Diagnosis not present

## 2023-03-17 DIAGNOSIS — E039 Hypothyroidism, unspecified: Secondary | ICD-10-CM | POA: Insufficient documentation

## 2023-03-17 DIAGNOSIS — Z09 Encounter for follow-up examination after completed treatment for conditions other than malignant neoplasm: Secondary | ICD-10-CM | POA: Diagnosis not present

## 2023-03-17 HISTORY — DX: Hypothyroidism, unspecified: E03.9

## 2023-03-17 HISTORY — PX: POLYPECTOMY: SHX5525

## 2023-03-17 HISTORY — DX: Sleep apnea, unspecified: G47.30

## 2023-03-17 HISTORY — PX: COLONOSCOPY WITH PROPOFOL: SHX5780

## 2023-03-17 SURGERY — COLONOSCOPY WITH PROPOFOL
Anesthesia: General

## 2023-03-17 MED ORDER — PROPOFOL 10 MG/ML IV BOLUS
INTRAVENOUS | Status: DC | PRN
  Administered 2023-03-17: 20 mg via INTRAVENOUS
  Administered 2023-03-17: 30 mg via INTRAVENOUS
  Administered 2023-03-17: 60 mg via INTRAVENOUS
  Administered 2023-03-17: 20 mg via INTRAVENOUS

## 2023-03-17 MED ORDER — LIDOCAINE HCL (CARDIAC) PF 100 MG/5ML IV SOSY
PREFILLED_SYRINGE | INTRAVENOUS | Status: DC | PRN
  Administered 2023-03-17: 50 mg via INTRAVENOUS

## 2023-03-17 MED ORDER — PROPOFOL 500 MG/50ML IV EMUL
INTRAVENOUS | Status: DC | PRN
  Administered 2023-03-17: 140 ug/kg/min via INTRAVENOUS

## 2023-03-17 MED ORDER — SODIUM CHLORIDE 0.9 % IV SOLN: INTRAVENOUS | Status: DC

## 2023-03-17 MED ORDER — STERILE WATER FOR IRRIGATION IR SOLN
Status: DC | PRN
  Administered 2023-03-17: 120 mL

## 2023-03-17 NOTE — Interval H&P Note (Signed)
History and Physical Interval Note: Preprocedure H&P from 03/17/23  was reviewed and there was no interval change after seeing and examining the patient.  Written consent was obtained from the patient after discussion of risks, benefits, and alternatives. Patient has consented to proceed with Colonoscopy with possible intervention   03/17/2023 8:20 AM  [name]  has presented today for surgery, with the diagnosis of V12.72 (ICD-9-CM) - Z86.010 (ICD-10-CM) - Personal history of colonic polyps.  The various methods of treatment have been discussed with the patient and family. After consideration of risks, benefits and other options for treatment, the patient has consented to  Procedure(s): COLONOSCOPY WITH PROPOFOL (N/A) as a surgical intervention.  The patient's history has been reviewed, patient examined, no change in status, stable for surgery.  I have reviewed the patient's chart and labs.  Questions were answered to the patient's satisfaction.     Leslie Hale

## 2023-03-17 NOTE — Anesthesia Preprocedure Evaluation (Signed)
Anesthesia Evaluation  Patient identified by MRN, date of birth, ID band Patient awake    Reviewed: Allergy & Precautions, NPO status , Patient's Chart, lab work & pertinent test results  History of Anesthesia Complications (+) PONV and history of anesthetic complications  Airway Mallampati: II  TM Distance: >3 FB Neck ROM: full    Dental no notable dental hx.    Pulmonary sleep apnea and Continuous Positive Airway Pressure Ventilation    Pulmonary exam normal        Cardiovascular hypertension, On Medications negative cardio ROS Normal cardiovascular exam     Neuro/Psych negative neurological ROS  negative psych ROS   GI/Hepatic negative GI ROS, Neg liver ROS,,,  Endo/Other  Hypothyroidism    Renal/GU negative Renal ROS  negative genitourinary   Musculoskeletal   Abdominal   Peds  Hematology negative hematology ROS (+)   Anesthesia Other Findings Past Medical History: No date: Hypertension No date: Hypothyroidism No date: PONV (postoperative nausea and vomiting) No date: Sleep apnea  Past Surgical History: No date: APPENDECTOMY 01/12/2022: BREAST BIOPSY; Right     Comment:  stereo bx of calcs, coil marker, path pending No date: CHOLECYSTECTOMY 05/22/2021: INTRAOPERATIVE CHOLANGIOGRAM; N/A     Comment:  Procedure: INTRAOPERATIVE CHOLANGIOGRAM;  Surgeon:               Carolan Shiver, MD;  Location: [facility] ORS;  Service:              General;  Laterality: N/A; No date: THYROIDECTOMY  BMI    Body Mass Index: 30.97 kg/m      Reproductive/Obstetrics negative OB ROS                             Anesthesia Physical Anesthesia Plan  ASA: 3  Anesthesia Plan: General   Post-op Pain Management: Minimal or no pain anticipated   Induction: Intravenous  PONV Risk Score and Plan: 3 and Propofol infusion and TIVA  Airway Management Planned: Natural Airway and Nasal  Cannula  Additional Equipment:   Intra-op Plan:   Post-operative Plan:   Informed Consent: I have reviewed the patients History and Physical, chart, labs and discussed the procedure including the risks, benefits and alternatives for the proposed anesthesia with the patient or authorized representative who has indicated his/her understanding and acceptance.     Dental Advisory Given  Plan Discussed with: Anesthesiologist, CRNA and Surgeon  Anesthesia Plan Comments: (Patient consented for risks of anesthesia including but not limited to:  - adverse reactions to medications - risk of airway placement if required - damage to eyes, teeth, lips or other oral mucosa - nerve damage due to positioning  - sore throat or hoarseness - Damage to heart, brain, nerves, lungs, other parts of body or loss of life  Patient voiced understanding and assent.)       Anesthesia Quick Evaluation

## 2023-03-17 NOTE — Anesthesia Postprocedure Evaluation (Signed)
Anesthesia Post Note  Patient: Leslie Hale  Procedure(s) Performed: COLONOSCOPY WITH PROPOFOL POLYPECTOMY  Patient location during evaluation: Endoscopy Anesthesia Type: General Level of consciousness: awake and alert Pain management: pain level controlled Vital Signs Assessment: post-procedure vital signs reviewed and stable Respiratory status: spontaneous breathing, nonlabored ventilation, respiratory function stable and patient connected to nasal cannula oxygen Cardiovascular status: blood pressure returned to baseline and stable Postop Assessment: no apparent nausea or vomiting Anesthetic complications: no   No notable events documented.   Last Vitals:  Vitals:   03/17/23 0916 03/17/23 0926  BP: 108/80 120/62  Pulse:    Resp:    Temp:    SpO2:      Last Pain:  Vitals:   03/17/23 0926  TempSrc:   PainSc: 0-No pain                 Louie Boston

## 2023-03-17 NOTE — H&P (Signed)
Pre-Procedure H&P   Patient ID: Leslie Hale is a 67 y.o. female.  Gastroenterology Provider: Jaynie Collins, DO  Referring Provider: Tawni Pummel, PA PCP: Mick Sell, MD  Date: 03/17/2023  HPI [name] is a 67 y.o. female who presents today for Colonoscopy for surveillance- phx colon polyps.  Patient with occasional loose stools, worse since cholecystectomy in 2023.  Most often has 1 bowel movement per day no melena or hematochezia.  Occasionally have food triggers with looser bowel movements. Does better when taking metamucil.  No family history of colon cancer or colon parents. One episode of bright red blood per rectum.  No upper GI symptoms.  Last colon[place] performed in [place] in 2020 demonstrating polyps.  1 of which was sessile serrated.  Others appear to be hyperplastic per report.  Sigmoid diverticulosis also present Hemoglobin 13 MCV 98 platelets 255,000   Past Medical History:  Diagnosis Date   Hypertension    Hypothyroidism    PONV (postoperative nausea and vomiting)    Sleep apnea     Past Surgical History:  Procedure Laterality Date   APPENDECTOMY     BREAST BIOPSY Right 01/12/2022   stereo bx of calcs, coil marker, path pending   CHOLECYSTECTOMY     INTRAOPERATIVE CHOLANGIOGRAM N/A 05/22/2021   Procedure: INTRAOPERATIVE CHOLANGIOGRAM;  Surgeon: Carolan Shiver, MD;  Location: [facility] ORS;  Service: General;  Laterality: N/A;   THYROIDECTOMY      Family History No h/o GI disease or malignancy  Review of Systems  Constitutional:  Negative for activity change, appetite change, chills, diaphoresis, fatigue, fever and unexpected weight change.  HENT:  Negative for trouble swallowing and voice change.   Respiratory:  Negative for shortness of breath and wheezing.   Cardiovascular:  Negative for chest pain, palpitations and leg swelling.  Gastrointestinal:  Negative for abdominal distention, abdominal pain,  anal bleeding, blood in stool, constipation, diarrhea, nausea, rectal pain and vomiting.  Musculoskeletal:  Negative for arthralgias and myalgias.  Skin:  Negative for color change and pallor.  Neurological:  Negative for dizziness, syncope and weakness.  Psychiatric/Behavioral:  Negative for confusion.   All other systems reviewed and are negative.    Medications No current facility-administered medications on file prior to encounter.   Current Outpatient Medications on File Prior to Encounter  Medication Sig Dispense Refill   levothyroxine (SYNTHROID) 100 MCG tablet Take 100 mcg by mouth daily before breakfast.     atenolol (TENORMIN) 50 MG tablet Take 50 mg by mouth daily.     Cholecalciferol 50 MCG (2000 UT) TABS Take 1 tablet by mouth in the morning and at bedtime. (Patient not taking: Reported on 05/22/2021)      Pertinent medications related to GI and procedure were reviewed by me with the patient prior to the procedure   Current Facility-Administered Medications:    0.9 %  sodium chloride infusion, , Intravenous, Continuous, Leslie Collins, DO, Last Rate: 20 mL/hr at 03/17/23 0809, New Bag at 03/17/23 0809  sodium chloride 20 mL/hr at 03/17/23 0809       Allergies  Allergen Reactions   Codeine    Sulfa Antibiotics Hives   Allergies were reviewed by me prior to the procedure  Objective   Body mass index is 30.97 kg/m. Vitals:   03/10/23 1302 03/17/23 0807  BP:  (!) 159/78  Pulse:  63  Resp:  16  Temp:  (!) 97.3 F (36.3 C)  TempSrc:  Temporal  SpO2:  100%  Weight: 81.6 kg 81.8 kg  Height: 5\' 4"  (1.626 m) 5\' 4"  (1.626 m)     Physical Exam Vitals and nursing note reviewed.  Constitutional:      General: She is not in acute distress.    Appearance: Normal appearance. She is not ill-appearing, toxic-appearing or diaphoretic.  HENT:     Head: Normocephalic and atraumatic.     Nose: Nose normal.     Mouth/Throat:     Mouth: Mucous membranes are  moist.     Pharynx: Oropharynx is clear.  Eyes:     General: No scleral icterus.    Extraocular Movements: Extraocular movements intact.  Cardiovascular:     Rate and Rhythm: Normal rate and regular rhythm.     Heart sounds: Normal heart sounds. No murmur heard.    No friction rub. No gallop.  Pulmonary:     Effort: Pulmonary effort is normal. No respiratory distress.     Breath sounds: Normal breath sounds. No wheezing, rhonchi or rales.  Abdominal:     General: Bowel sounds are normal. There is no distension.     Palpations: Abdomen is soft.     Tenderness: There is no abdominal tenderness. There is no guarding or rebound.  Musculoskeletal:     Cervical back: Neck supple.     Right lower leg: No edema.     Left lower leg: No edema.  Skin:    General: Skin is warm and dry.     Coloration: Skin is not jaundiced or pale.  Neurological:     General: No focal deficit present.     Mental Status: She is alert and oriented to person, place, and time. Mental status is at baseline.  Psychiatric:        Mood and Affect: Mood normal.        Behavior: Behavior normal.        Thought Content: Thought content normal.        Judgment: Judgment normal.      Assessment:  [name] is a 67 y.o. female  who presents today for Colonoscopy for  surveillance- phx colon polyps.  Plan:  Colonoscopy with possible intervention today  Colonoscopy with possible biopsy, control of bleeding, polypectomy, and interventions as necessary has been discussed with the patient/patient representative. Informed consent was obtained from the patient/patient representative after explaining the indication, nature, and risks of the procedure including but not limited to death, bleeding, perforation, missed neoplasm/lesions, cardiorespiratory compromise, and reaction to medications. Opportunity for questions was given and appropriate answers were provided. Patient/patient representative has verbalized  understanding is amenable to undergoing the procedure.   Leslie Collins, DO  [facility]  Portions of the record may have been created with voice recognition software. Occasional wrong-word or 'sound-a-like' substitutions may have occurred due to the inherent limitations of voice recognition software.  Read the chart carefully and recognize, using context, where substitutions may have occurred.

## 2023-03-17 NOTE — Op Note (Signed)
[facility] Gastroenterology Patient Name: Leslie Hale Procedure Date: 03/17/2023 7:11 AM MRN: [id number] Account #: [id number] Date of Birth: 29-Aug-1955 Admit Type: Outpatient Age: 67 Room: [facility] ENDO ROOM 1 Gender: Female Note Status: Finalized Instrument Name: Colonoscope [id number] Procedure:             Colonoscopy Indications:           High risk colon cancer surveillance: Personal history                         of colonic polyps Providers:             Trenda Moots, DO Referring MD:          Clydie Braun (Referring MD) Medicines:             Monitored Anesthesia Care Complications:         No immediate complications. Estimated blood loss:                         Minimal. Procedure:             Pre-Anesthesia Assessment:                        - Prior to the procedure, a History and Physical was                         performed, and patient medications and allergies were                         reviewed. The patient is competent. The risks and                         benefits of the procedure and the sedation options and                         risks were discussed with the patient. All questions                         were answered and informed consent was obtained.                         Patient identification and proposed procedure were                         verified by the physician, the nurse, the anesthetist                         and the technician in the endoscopy suite. Mental                         Status Examination: alert and oriented. Airway                         Examination: normal oropharyngeal airway and neck                         mobility. Respiratory Examination: clear to  auscultation. CV Examination: RRR, no murmurs, no S3                         or S4. Prophylactic Antibiotics: The patient does not                         require prophylactic antibiotics. Prior                          Anticoagulants: The patient has taken no anticoagulant                         or antiplatelet agents. ASA Grade Assessment: III - A                         patient with severe systemic disease. After reviewing                         the risks and benefits, the patient was deemed in                         satisfactory condition to undergo the procedure. The                         anesthesia plan was to use monitored anesthesia care                         (MAC). Immediately prior to administration of                         medications, the patient was re-assessed for adequacy                         to receive sedatives. The heart rate, respiratory                         rate, oxygen saturations, blood pressure, adequacy of                         pulmonary ventilation, and response to care were                         monitored throughout the procedure. The physical                         status of the patient was re-assessed after the                         procedure.                        After obtaining informed consent, the colonoscope was                         passed under direct vision. Throughout the procedure,                         the patient's blood pressure, pulse, and oxygen  saturations were monitored continuously. The                         Colonoscope was introduced through the anus and                         advanced to the the terminal ileum, with                         identification of the appendiceal orifice and IC                         valve. The colonoscopy was performed without                         difficulty. The patient tolerated the procedure well.                         The quality of the bowel preparation was evaluated                         using the BBPS [facility] Bowel Preparation Scale) with                         scores of: Right Colon = 3 (entire mucosa seen well                         with no residual staining,  small fragments of stool or                         opaque liquid), Transverse Colon = 3 (entire mucosa                         seen well with no residual staining, small fragments                         of stool or opaque liquid) and Left Colon = 2 (minor                         amount of residual staining, small fragments of stool                         and/or opaque liquid, but mucosa seen well). The total                         BBPS score equals 8. The quality of the bowel                         preparation was excellent. The terminal ileum,                         ileocecal valve, appendiceal orifice, and rectum were                         photographed. Findings:      The perianal and digital rectal examinations were normal. Pertinent       negatives include normal sphincter tone.  The terminal ileum appeared normal. Estimated blood loss: none.      Multiple small-mouthed diverticula were found in the sigmoid colon.       Estimated blood loss: none.      Non-bleeding internal hemorrhoids were found during retroflexion. The       hemorrhoids were Grade I (internal hemorrhoids that do not prolapse).       Estimated blood loss: none.      A 16 to 18 mm polyp was found in the rectum. The polyp was pedunculated.       The polyp was removed with a hot snare. Resection and retrieval were       complete. Estimated blood loss was minimal. To prevent bleeding after       the polypectomy, one hemostatic clip was successfully placed (MR       conditional). There was no bleeding at the end of the procedure.       Estimated blood loss: none.      Six sessile polyps were found in the rectum (2), transverse colon (2)       and ascending colon (2). The polyps were 2 to 4 mm in size. These polyps       were removed with a cold snare. Resection and retrieval were complete.       Estimated blood loss was minimal.      A 2 mm polyp was found in the transverse colon. The polyp was sessile.        The polyp was removed with a cold snare. Resection was complete, but the       polyp tissue was not retrieved. Estimated blood loss was minimal.      Six sessile polyps were found in the sigmoid colon (3), descending colon       (2) and transverse colon (1). The polyps were 1 to 2 mm in size. These       polyps were removed with a jumbo cold forceps. Resection and retrieval       were complete. Estimated blood loss was minimal.      The exam was otherwise without abnormality on direct and retroflexion       views. Impression:            - The examined portion of the ileum was normal.                        - Diverticulosis in the sigmoid colon.                        - Non-bleeding internal hemorrhoids.                        - One 16 to 18 mm polyp in the rectum, removed with a                         hot snare. Resected and retrieved. Clip (MR                         conditional) was placed.                        - Six 2 to 4 mm polyps in the rectum, in the  transverse colon and in the ascending colon, removed                         with a cold snare. Resected and retrieved.                        - One 2 mm polyp in the transverse colon, removed with                         a cold snare. Complete resection. Polyp tissue not                         retrieved.                        - Six 1 to 2 mm polyps in the sigmoid colon, in the                         descending colon and in the transverse colon, removed                         with a jumbo cold forceps. Resected and retrieved.                        - The examination was otherwise normal on direct and                         retroflexion views. Recommendation:        - Patient has a contact number available for                         emergencies. The signs and symptoms of potential                         delayed complications were discussed with the patient.                         Return to normal  activities tomorrow. Written                         discharge instructions were provided to the patient.                        - Discharge patient to home.                        - Resume previous diet.                        - Continue present medications.                        - No aspirin, ibuprofen, naproxen, or other                         non-steroidal anti-inflammatory drugs for 5 days after                         polyp removal.                        -  Await pathology results.                        - Repeat colonoscopy for surveillance based on                         pathology results.                        - Return to referring physician as previously                         scheduled.                        - The findings and recommendations were discussed with                         the patient. Procedure Code(s):     --- Professional ---                        [phone], Colonoscopy, flexible; with removal of                         tumor(s), polyp(s), or other lesion(s) by snare                         technique                        45380, 59, Colonoscopy, flexible; with biopsy, single                         or multiple Diagnosis Code(s):     --- Professional ---                        Z86.010, Personal history of colonic polyps                        K64.0, First degree hemorrhoids                        D12.8, Benign neoplasm of rectum                        D12.3, Benign neoplasm of transverse colon (hepatic                         flexure or splenic flexure)                        D12.2, Benign neoplasm of ascending colon                        D12.5, Benign neoplasm of sigmoid colon                        D12.4, Benign neoplasm of descending colon                        K57.30, Diverticulosis of large intestine without  perforation or abscess without bleeding CPT copyright 2022 [employer]. All rights reserved. The  codes documented in this report are preliminary and upon coder review may  be revised to meet current compliance requirements. Attending Participation:      I personally performed the entire procedure. Elfredia Nevins, DO Jaynie Collins DO, DO 03/17/2023 9:13:21 AM This report has been signed electronically. Number of Addenda: 0 Note Initiated On: 03/17/2023 7:11 AM Total Procedure Duration: 0 hours 37 minutes 51 seconds  Estimated Blood Loss:  Estimated blood loss was minimal.      [facility]

## 2023-03-17 NOTE — Transfer of Care (Signed)
Immediate Anesthesia Transfer of Care Note  Patient: Leslie Hale  Procedure(s) Performed: COLONOSCOPY WITH PROPOFOL POLYPECTOMY  Patient Location: PACU  Anesthesia Type:General  Level of Consciousness: drowsy  Airway & Oxygen Therapy: Patient Spontanous Breathing  Post-op Assessment: Report given to RN and Post -op Vital signs reviewed and stable  Post vital signs: Reviewed and stable  Last Vitals:  Vitals Value Taken Time  BP 96/55 03/17/23 0906  Temp 36.4 C 03/17/23 0906  Pulse 50 03/17/23 0907  Resp 13 03/17/23 0907  SpO2 98 % 03/17/23 0907  Vitals shown include unfiled device data.  Last Pain:  Vitals:   03/17/23 0906  TempSrc: Temporal  PainSc: Asleep         Complications: No notable events documented.

## 2023-03-18 ENCOUNTER — Encounter: Payer: Self-pay | Admitting: Gastroenterology

## 2023-03-21 LAB — SURGICAL PATHOLOGY

## 2023-05-23 ENCOUNTER — Other Ambulatory Visit: Payer: Self-pay | Admitting: Infectious Diseases

## 2023-05-23 DIAGNOSIS — Z1231 Encounter for screening mammogram for malignant neoplasm of breast: Secondary | ICD-10-CM

## 2023-05-24 ENCOUNTER — Other Ambulatory Visit: Payer: Self-pay | Admitting: Infectious Diseases

## 2023-05-24 DIAGNOSIS — R921 Mammographic calcification found on diagnostic imaging of breast: Secondary | ICD-10-CM
